# Patient Record
Sex: Female | Born: 1960 | Hispanic: Yes | Marital: Single | Smoking: Never smoker
Health system: Southern US, Community
[De-identification: ages and names within clinical notes are randomized; demographics above are authoritative.]

## PROBLEM LIST (undated history)

## (undated) DIAGNOSIS — D649 Anemia, unspecified: Secondary | ICD-10-CM

## (undated) DIAGNOSIS — I10 Essential (primary) hypertension: Secondary | ICD-10-CM

## (undated) DIAGNOSIS — J45909 Unspecified asthma, uncomplicated: Secondary | ICD-10-CM

## (undated) HISTORY — PX: TUBAL LIGATION: SHX77

---

## 2001-11-03 ENCOUNTER — Encounter: Payer: Self-pay | Admitting: Emergency Medicine

## 2001-11-03 ENCOUNTER — Emergency Department (HOSPITAL_COMMUNITY): Admission: EM | Admit: 2001-11-03 | Discharge: 2001-11-03 | Payer: Self-pay | Admitting: Emergency Medicine

## 2003-02-11 ENCOUNTER — Emergency Department (HOSPITAL_COMMUNITY): Admission: EM | Admit: 2003-02-11 | Discharge: 2003-02-12 | Payer: Self-pay | Admitting: Internal Medicine

## 2004-04-17 ENCOUNTER — Ambulatory Visit (HOSPITAL_COMMUNITY): Admission: RE | Admit: 2004-04-17 | Discharge: 2004-04-17 | Payer: Self-pay | Admitting: Family Medicine

## 2009-01-17 ENCOUNTER — Emergency Department (HOSPITAL_COMMUNITY): Admission: EM | Admit: 2009-01-17 | Discharge: 2009-01-18 | Payer: Self-pay | Admitting: Emergency Medicine

## 2010-06-03 LAB — URINALYSIS, ROUTINE W REFLEX MICROSCOPIC
Bilirubin Urine: NEGATIVE
Glucose, UA: NEGATIVE mg/dL
Hgb urine dipstick: NEGATIVE
Ketones, ur: NEGATIVE mg/dL
Nitrite: NEGATIVE
Protein, ur: NEGATIVE mg/dL
Specific Gravity, Urine: 1.015 (ref 1.005–1.030)
Urobilinogen, UA: 0.2 mg/dL (ref 0.0–1.0)
pH: 6 (ref 5.0–8.0)

## 2010-06-21 ENCOUNTER — Emergency Department (HOSPITAL_COMMUNITY)
Admission: EM | Admit: 2010-06-21 | Discharge: 2010-06-22 | Disposition: A | Discharge status: Home / Self Care | Payer: BC Managed Care – PPO | Attending: Emergency Medicine | Admitting: Emergency Medicine

## 2010-06-21 ENCOUNTER — Emergency Department (HOSPITAL_COMMUNITY): Payer: BC Managed Care – PPO

## 2010-06-21 DIAGNOSIS — R52 Pain, unspecified: Secondary | ICD-10-CM

## 2010-06-21 DIAGNOSIS — J45909 Unspecified asthma, uncomplicated: Secondary | ICD-10-CM | POA: Insufficient documentation

## 2010-06-21 DIAGNOSIS — R109 Unspecified abdominal pain: Secondary | ICD-10-CM | POA: Insufficient documentation

## 2010-06-21 DIAGNOSIS — I10 Essential (primary) hypertension: Secondary | ICD-10-CM | POA: Insufficient documentation

## 2010-06-21 LAB — COMPREHENSIVE METABOLIC PANEL
ALT: 21 U/L (ref 0–35)
AST: 23 U/L (ref 0–37)
Albumin: 3.8 g/dL (ref 3.5–5.2)
Alkaline Phosphatase: 105 U/L (ref 39–117)
BUN: 21 mg/dL (ref 6–23)
CO2: 27 mEq/L (ref 19–32)
Calcium: 10.4 mg/dL (ref 8.4–10.5)
Chloride: 102 mEq/L (ref 96–112)
Creatinine, Ser: 0.82 mg/dL (ref 0.4–1.2)
GFR calc Af Amer: >60 mL/min (ref >60)
GFR calc non Af Amer: >60 mL/min (ref >60)
Glucose, Bld: 102 mg/dL — ABNORMAL HIGH (ref 70–99)
Potassium: 3.3 mEq/L — ABNORMAL LOW (ref 3.5–5.1)
Sodium: 136 mEq/L (ref 135–145)
Total Bilirubin: 0.2 mg/dL — ABNORMAL LOW (ref 0.3–1.2)
Total Protein: 7.7 g/dL (ref 6.0–8.3)

## 2010-06-21 LAB — LIPASE, BLOOD: Lipase: 34 U/L (ref 11–59)

## 2010-06-21 LAB — URINE MICROSCOPIC-ADD ON

## 2010-06-21 LAB — DIFFERENTIAL
Basophils Absolute: 0.1 10*3/uL (ref 0.0–0.1)
Basophils Relative: 1 % (ref 0–1)
Eosinophils Absolute: 0.4 10*3/uL (ref 0.0–0.7)
Eosinophils Relative: 4 % (ref 0–5)
Lymphocytes Relative: 41 % (ref 12–46)
Lymphs Abs: 3.6 10*3/uL (ref 0.7–4.0)
Monocytes Absolute: 0.5 10*3/uL (ref 0.1–1.0)
Monocytes Relative: 6 % (ref 3–12)
Neutro Abs: 4.3 10*3/uL (ref 1.7–7.7)
Neutrophils Relative %: 49 % (ref 43–77)

## 2010-06-21 LAB — CBC
HCT: 39.7 % (ref 36.0–46.0)
Hemoglobin: 12.2 g/dL (ref 12.0–15.0)
MCH: 24.9 pg — ABNORMAL LOW (ref 26.0–34.0)
MCHC: 30.7 g/dL (ref 30.0–36.0)
MCV: 81 fL (ref 78.0–100.0)
Platelets: 353 10*3/uL (ref 150–400)
RBC: 4.9 MIL/uL (ref 3.87–5.11)
RDW: 16.6 % — ABNORMAL HIGH (ref 11.5–15.5)
WBC: 8.9 10*3/uL (ref 4.0–10.5)

## 2010-06-21 LAB — URINALYSIS, ROUTINE W REFLEX MICROSCOPIC
Bilirubin Urine: NEGATIVE
Glucose, UA: NEGATIVE mg/dL
Ketones, ur: NEGATIVE mg/dL
Leukocytes, UA: NEGATIVE
Nitrite: NEGATIVE
Protein, ur: NEGATIVE mg/dL
Specific Gravity, Urine: >1.03 — ABNORMAL HIGH (ref 1.005–1.030)
Urobilinogen, UA: 0.2 mg/dL (ref 0.0–1.0)
pH: 6 (ref 5.0–8.0)

## 2010-06-22 ENCOUNTER — Ambulatory Visit (HOSPITAL_COMMUNITY): Payer: BC Managed Care – PPO

## 2011-01-04 ENCOUNTER — Other Ambulatory Visit (HOSPITAL_COMMUNITY): Payer: Self-pay | Admitting: Internal Medicine

## 2011-01-04 DIAGNOSIS — N63 Unspecified lump in unspecified breast: Secondary | ICD-10-CM

## 2011-01-27 ENCOUNTER — Ambulatory Visit (HOSPITAL_COMMUNITY)
Admission: RE | Admit: 2011-01-27 | Discharge: 2011-01-27 | Disposition: A | Discharge status: Home / Self Care | Payer: BC Managed Care – PPO | Source: Ambulatory Visit | Attending: Internal Medicine | Admitting: Internal Medicine

## 2011-01-27 DIAGNOSIS — N63 Unspecified lump in unspecified breast: Secondary | ICD-10-CM

## 2011-01-27 DIAGNOSIS — N644 Mastodynia: Secondary | ICD-10-CM | POA: Insufficient documentation

## 2011-11-12 ENCOUNTER — Encounter (HOSPITAL_COMMUNITY): Payer: Self-pay | Admitting: *Deleted

## 2011-11-12 ENCOUNTER — Emergency Department (HOSPITAL_COMMUNITY)
Admission: EM | Admit: 2011-11-12 | Discharge: 2011-11-13 | Disposition: A | Discharge status: Home / Self Care | Payer: Medicaid Other | Attending: Emergency Medicine | Admitting: Emergency Medicine

## 2011-11-12 ENCOUNTER — Emergency Department (HOSPITAL_COMMUNITY): Payer: Medicaid Other

## 2011-11-12 DIAGNOSIS — R0981 Nasal congestion: Secondary | ICD-10-CM

## 2011-11-12 DIAGNOSIS — I10 Essential (primary) hypertension: Secondary | ICD-10-CM | POA: Insufficient documentation

## 2011-11-12 DIAGNOSIS — R079 Chest pain, unspecified: Secondary | ICD-10-CM

## 2011-11-12 DIAGNOSIS — R0602 Shortness of breath: Secondary | ICD-10-CM | POA: Insufficient documentation

## 2011-11-12 DIAGNOSIS — J45909 Unspecified asthma, uncomplicated: Secondary | ICD-10-CM | POA: Insufficient documentation

## 2011-11-12 DIAGNOSIS — R072 Precordial pain: Secondary | ICD-10-CM | POA: Insufficient documentation

## 2011-11-12 DIAGNOSIS — R059 Cough, unspecified: Secondary | ICD-10-CM | POA: Insufficient documentation

## 2011-11-12 DIAGNOSIS — R05 Cough: Secondary | ICD-10-CM | POA: Insufficient documentation

## 2011-11-12 DIAGNOSIS — J3489 Other specified disorders of nose and nasal sinuses: Secondary | ICD-10-CM | POA: Insufficient documentation

## 2011-11-12 DIAGNOSIS — M25569 Pain in unspecified knee: Secondary | ICD-10-CM | POA: Insufficient documentation

## 2011-11-12 HISTORY — DX: Unspecified asthma, uncomplicated: J45.909

## 2011-11-12 HISTORY — DX: Essential (primary) hypertension: I10

## 2011-11-12 HISTORY — DX: Anemia, unspecified: D64.9

## 2011-11-12 NOTE — ED Notes (Signed)
Interpreter at bedside for patient - patient states that she has had nasal congestion x1 week, states she is not feeling any better, currently taking abx and using Flonase.  States she is just tired of not being able to breathe through her nose, however, can breathe through her mouth fine.  No acute distress, no dyspnea, no tachypnea noted.

## 2011-11-12 NOTE — ED Notes (Signed)
Family reports [facility]. Started on antibiotics (amoxicillian) & a nasal spray. States feeling worse at this time. Also complaining of knee pain. Pt states not feeling any better.

## 2011-11-12 NOTE — ED Notes (Signed)
Patient transported to X-ray 

## 2011-11-13 ENCOUNTER — Emergency Department (HOSPITAL_COMMUNITY): Payer: Medicaid Other

## 2011-11-13 LAB — CBC WITH DIFFERENTIAL/PLATELET
Basophils Absolute: 0.1 10*3/uL (ref 0.0–0.1)
Basophils Relative: 1 % (ref 0–1)
Eosinophils Absolute: 0.8 10*3/uL — ABNORMAL HIGH (ref 0.0–0.7)
Eosinophils Relative: 8 % — ABNORMAL HIGH (ref 0–5)
HCT: 36.7 % (ref 36.0–46.0)
Hemoglobin: 11.8 g/dL — ABNORMAL LOW (ref 12.0–15.0)
Lymphocytes Relative: 43 % (ref 12–46)
Lymphs Abs: 3.9 10*3/uL (ref 0.7–4.0)
MCH: 28.1 pg (ref 26.0–34.0)
MCHC: 32.2 g/dL (ref 30.0–36.0)
MCV: 87.4 fL (ref 78.0–100.0)
Monocytes Absolute: 0.7 10*3/uL (ref 0.1–1.0)
Monocytes Relative: 8 % (ref 3–12)
Neutro Abs: 3.6 10*3/uL (ref 1.7–7.7)
Neutrophils Relative %: 40 % — ABNORMAL LOW (ref 43–77)
Platelets: 294 10*3/uL (ref 150–400)
RBC: 4.2 MIL/uL (ref 3.87–5.11)
RDW: 14.7 % (ref 11.5–15.5)
WBC: 9 10*3/uL (ref 4.0–10.5)

## 2011-11-13 LAB — BASIC METABOLIC PANEL
BUN: 17 mg/dL (ref 6–23)
CO2: 26 mEq/L (ref 19–32)
Calcium: 10.5 mg/dL (ref 8.4–10.5)
Chloride: 104 mEq/L (ref 96–112)
Creatinine, Ser: 0.67 mg/dL (ref 0.50–1.10)
GFR calc Af Amer: >90 mL/min (ref >90)
GFR calc non Af Amer: >90 mL/min (ref >90)
Glucose, Bld: 109 mg/dL — ABNORMAL HIGH (ref 70–99)
Potassium: 3.5 mEq/L (ref 3.5–5.1)
Sodium: 138 mEq/L (ref 135–145)

## 2011-11-13 LAB — PRO B NATRIURETIC PEPTIDE: Pro B Natriuretic peptide (BNP): 9.3 pg/mL (ref 0–125)

## 2011-11-13 LAB — TROPONIN I: Troponin I: <0.3 ng/mL (ref <0.30)

## 2011-11-13 MED ORDER — OXYMETAZOLINE HCL 0.05 % NA SOLN
1.0000 | Freq: Once | NASAL | Status: AC
  Administered 2011-11-13: 1 via NASAL
  Filled 2011-11-13: qty 15

## 2011-11-13 MED ORDER — SODIUM CHLORIDE 0.9 % IV SOLN
INTRAVENOUS | Status: DC
  Administered 2011-11-13: 01:00:00 via INTRAVENOUS

## 2011-11-13 MED ORDER — PREDNISONE 20 MG PO TABS
60.0000 mg | ORAL_TABLET | Freq: Once | ORAL | Status: AC
  Administered 2011-11-13: 60 mg via ORAL
  Filled 2011-11-13: qty 3

## 2011-11-13 MED ORDER — ALBUTEROL SULFATE (5 MG/ML) 0.5% IN NEBU
5.0000 mg | INHALATION_SOLUTION | Freq: Once | RESPIRATORY_TRACT | Status: AC
  Administered 2011-11-13: 5 mg via RESPIRATORY_TRACT
  Filled 2011-11-13: qty 1

## 2011-11-13 NOTE — ED Provider Notes (Signed)
0120 Assumed care/disposition of patient who presents with nasal congestions, shortness of breath, central chest pain. Awaiting labs and xray.   Results for orders placed during the hospital encounter of 11/12/11  CBC WITH DIFFERENTIAL      Component Value Range   WBC 9.0  4.0 - 10.5 K/uL   RBC 4.20  3.87 - 5.11 MIL/uL   Hemoglobin 11.8 (*) 12.0 - 15.0 g/dL   HCT 16.1  09.6 - 04.5 %   MCV 87.4  78.0 - 100.0 fL   MCH 28.1  26.0 - 34.0 pg   MCHC 32.2  30.0 - 36.0 g/dL   RDW 40.9  81.1 - 91.4 %   Platelets 294  150 - 400 K/uL   Neutrophils Relative 40 (*) 43 - 77 %   Neutro Abs 3.6  1.7 - 7.7 K/uL   Lymphocytes Relative 43  12 - 46 %   Lymphs Abs 3.9  0.7 - 4.0 K/uL   Monocytes Relative 8  3 - 12 %   Monocytes Absolute 0.7  0.1 - 1.0 K/uL   Eosinophils Relative 8 (*) 0 - 5 %   Eosinophils Absolute 0.8 (*) 0.0 - 0.7 K/uL   Basophils Relative 1  0 - 1 %   Basophils Absolute 0.1  0.0 - 0.1 K/uL  BASIC METABOLIC PANEL      Component Value Range   Sodium 138  135 - 145 mEq/L   Potassium 3.5  3.5 - 5.1 mEq/L   Chloride 104  96 - 112 mEq/L   CO2 26  19 - 32 mEq/L   Glucose, Bld 109 (*) 70 - 99 mg/dL   BUN 17  6 - 23 mg/dL   Creatinine, Ser 7.82  0.50 - 1.10 mg/dL   Calcium 95.6  8.4 - 21.3 mg/dL   GFR calc non Af Amer >90  >90 mL/min   GFR calc Af Amer >90  >90 mL/min  TROPONIN I      Component Value Range   Troponin I <0.30  <0.30 ng/mL  PRO B NATRIURETIC PEPTIDE      Component Value Range   Pro B Natriuretic peptide (BNP) 9.3  0 - 125 pg/mL   Dg Chest 2 View  11/13/2011  *RADIOLOGY REPORT*  Clinical Data: Cough and shortness of breath.  History of asthma.  CHEST - 2 VIEW  Comparison: 01/17/2009 and 02/11/2003 Chest radiographs.  Findings: Upper limits normal heart size again noted. Peribronchial thickening is unchanged.  There is no evidence of focal airspace disease, pulmonary edema, suspicious pulmonary nodule/mass, pleural effusion, or pneumothorax. No acute bony abnormalities  are identified.  IMPRESSION: No evidence of acute cardiopulmonary disease.  Upper limits normal heart size and chronic peribronchial thickening.   Original Report Authenticated By: Rosendo Gros, M.D.    Dg Knee Complete 4 Views Right  11/13/2011  *RADIOLOGY REPORT*  Clinical Data: Chronic right knee pain.  RIGHT KNEE - COMPLETE 4+ VIEW  Comparison: None.  Findings: No fracture or dislocation.  No aggressive osseous lesions.  There may be a joint effusion.  IMPRESSION: Possible joint effusion.  No acute osseous finding.   Original Report Authenticated By: Waneta Martins, M.D.    [facility] Dx testing d/w pt and daughter  Questions answered.  Verb understanding, agreeable to d/c home with outpt f/u.Patient given nebulizer treatment with improvement. Given afrin with improvement. She will finish prescribed medicines from the clinic and follow up with them. Daughter served as Nurse, learning disability.  MDM Reviewed: nursing  note and vitals Interpretation: labs and x-ray       Nicoletta Dress. Colon Branch, MD 11/13/11 [phone]

## 2011-11-13 NOTE — ED Provider Notes (Signed)
History     CSN: [id number]  Arrival date & time 11/12/11  2307   First MD Initiated Contact with Patient 11/12/11 2330      Chief Complaint  Patient presents with  . Chest Pain  . Shortness of Breath    (Consider location/radiation/quality/duration/timing/severity/associated sxs/prior treatment) HPI Comments: Pain is mid-sternal and "heavy".  States she has been having the pain and trouble breathing x 3 weeks.  She has a h/o asthma and HTN .  Over the past 2 years , probably every 3 months she has episodes of "fast heart beat and thinks she is going to pass out"  Last episode ~ 2 months ago.  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  Patient is a 51 y.o. female presenting with chest pain and shortness of breath. The history is provided by the patient. Language Interpreter Used: pt's daughter transating from spanish.  Chest Pain Episode onset: 3 weeks ago. Chest pain occurs constantly. The chest pain is unchanged. The pain is associated with breathing. The severity of the pain is moderate. The quality of the pain is described as heavy. The pain does not radiate. Primary symptoms include shortness of breath, cough and wheezing. Pertinent negatives for primary symptoms include no fever. She tried nothing for the symptoms. Risk factors include obesity.  Her past medical history is significant for hypertension.  Pertinent negatives for past medical history include no diabetes, no hyperlipidemia, no MI, no pacemaker, no recent injury and no rheumatic fever.    Shortness of Breath  Associated symptoms include chest pain, cough, shortness of breath and wheezing. Pertinent negatives include no fever.    Past Medical History  Diagnosis Date  . Asthma   . Hypertension   . Anemia     Past Surgical History  Procedure Date  . Cesarean section   . Tubal ligation     History reviewed. No pertinent family history.  History  Substance Use Topics  .  Smoking status: Never Smoker   . Smokeless tobacco: Not on file  . Alcohol Use: No    OB History    Grav Para Term Preterm Abortions TAB SAB Ect Mult Living                  Review of Systems  Constitutional: Negative for fever and chills.  Respiratory: Positive for cough, chest tightness, shortness of breath and wheezing.   Cardiovascular: Positive for chest pain.  All other systems reviewed and are negative.    Allergies  Review of patient's allergies indicates no known allergies.  Home Medications   Current Outpatient Rx  Name Route Sig Dispense Refill  . ALBUTEROL SULFATE HFA 108 (90 BASE) MCG/ACT IN AERS Inhalation Inhale 2 puffs into the lungs every 6 (six) hours as needed.    . AMOXICILLIN 500 MG PO CAPS Oral Take 500 mg by mouth 2 (two) times daily.    Marland Kitchen FLUTICASONE FUROATE 27.5 MCG/SPRAY NA SUSP Nasal Place 1 spray into the nose 2 (two) times daily.    Marland Kitchen LISINOPRIL 10 MG PO TABS Oral Take 10 mg by mouth daily.      BP 153/92  Pulse 88  Temp 98.2 F (36.8 C) (Oral)  Resp 22  Ht 5\' 2"  (1.575 m)  Wt 218 lb (98.884 kg)  BMI 39.87 kg/m2  SpO2 93%  Physical Exam  Nursing note and vitals reviewed. Constitutional: She is oriented to person, place, and time. She appears well-developed  and well-nourished. No distress.  HENT:  Head: Normocephalic and atraumatic.  Eyes: EOM are normal.  Neck: Normal range of motion.  Cardiovascular: Normal rate, regular rhythm and normal heart sounds.   Pulmonary/Chest: Effort normal. No accessory muscle usage. Not tachypneic. No respiratory distress. She has no decreased breath sounds. She has wheezes. She has rhonchi. She has no rales. She exhibits no tenderness.    Abdominal: Soft. She exhibits no distension. There is no tenderness.  Musculoskeletal: Normal range of motion.  Neurological: She is alert and oriented to person, place, and time.  Skin: Skin is warm and dry.  Psychiatric: She has a normal mood and affect. Judgment  normal.    ED Course  Procedures (including critical care time)   Labs Reviewed  CBC WITH DIFFERENTIAL  BASIC METABOLIC PANEL  TROPONIN I  PRO B NATRIURETIC PEPTIDE   Dg Chest 2 View  11/13/2011  *RADIOLOGY REPORT*  Clinical Data: Cough and shortness of breath.  History of asthma.  CHEST - 2 VIEW  Comparison: 01/17/2009 and 02/11/2003 Chest radiographs.  Findings: Upper limits normal heart size again noted. Peribronchial thickening is unchanged.  There is no evidence of focal airspace disease, pulmonary edema, suspicious pulmonary nodule/mass, pleural effusion, or pneumothorax. No acute bony abnormalities are identified.  IMPRESSION: No evidence of acute cardiopulmonary disease.  Upper limits normal heart size and chronic peribronchial thickening.   Original Report Authenticated By: Rosendo Gros, M.D.      No diagnosis found.  Date: 11/13/2011  Rate: 76  Rhythm: normal sinus rhythm  QRS Axis: normal  Intervals: normal  ST/T Wave abnormalities: normal  Conduction Disutrbances:none  Narrative Interpretation:   Old EKG Reviewed: unchanged from 06-21-2010  0115=d/w dr. Colon Branch who has assumed care.   MDM          Evalina Field, PA 11/13/11 [phone]

## 2011-11-13 NOTE — ED Provider Notes (Signed)
Medical screening examination/treatment/procedure(s) were conducted as a shared visit with non-physician practitioner(s) and myself.  I personally evaluated the patient during the encounter  Jenny Gregory. Colon Branch, MD 11/13/11 [phone]

## 2012-01-03 ENCOUNTER — Other Ambulatory Visit: Payer: Self-pay | Admitting: Orthopedic Surgery

## 2012-01-10 NOTE — Progress Notes (Signed)
Talked with daughter-will need istat Interpretor requested-spanish

## 2012-01-11 NOTE — H&P (Signed)
HPI: Patient presents with a chief complaint of right knee pain that she's had intermittently for 6 years, but got dramatically worse 1 month ago.  She is now limping the pain wakes her up at night and over-the-counter Advil or Aleve have not helped.  She denies any other trauma.  She describes the pain as extremely severe with associated swelling worse over the last few days with a sharp and stabbing quality.  She is seen today with a family member who speaks good [place] as she does not. She was given a cortisone injection 12-19-11 on her first visit.  She reports that over half the pain is still gone, but she does walk with a limp and the pain is gradually returning.  All: None  ROS: 14 point review of systems form filled out by the patient was reviewed and was negative as it relates to the history of present illness except for:  She's not a diabetic, no elevated cholesterol, has a history of high blood pressure and asthma, but does not have her medicines with her.  PMH: Not contributory  FHx: Negative  SocHx:  She does not smoke.  She does not use alcohol she is single and lives alone another person and has been off work now for 10 months.  PHYSICAL EXAM: Well-developed, well-nourished.  Awake, alert, and oriented x3.  Extraocular motion is intact.  No use of accessory respiratory muscles for breathing.   Cardiovascular exam reveals a regular rhythm.  Skin is intact without cuts, scrapes, or abrasions. The right knee comes to full extension with significant medial joint line pain when he do this.  She flexes to 95, again with severe medial joint line pain when he do this.  There is no palpable effusion she is quite tender along the medial joint line, McMurray's test reproduces pain without palpable click.  She is neurovascularly intact no cuts.  Scrapes or abrasions to the skin.  Imaging/Tests: AP, obliques, and lateral x-rays done [facility] believe [facility] in [place] show a small  spur to the medial side of the left knee joint on the tibia and a good fit flare of cartilage.  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  We went over the risks and benefits of surgery.  She'll carefully weighing her options and call us back on the phone if she decides on surgery.  In the meantime, she will continue over-the-counter anti-inflammatory medicines and exercises.  I will see her back as needed.  She may call the schedule surgery at her convenience.

## 2012-01-12 ENCOUNTER — Encounter (HOSPITAL_BASED_OUTPATIENT_CLINIC_OR_DEPARTMENT_OTHER): Payer: Self-pay | Admitting: Anesthesiology

## 2012-01-12 ENCOUNTER — Ambulatory Visit (HOSPITAL_BASED_OUTPATIENT_CLINIC_OR_DEPARTMENT_OTHER): Payer: Medicaid Other | Admitting: Anesthesiology

## 2012-01-12 ENCOUNTER — Encounter (HOSPITAL_BASED_OUTPATIENT_CLINIC_OR_DEPARTMENT_OTHER): Payer: Self-pay | Admitting: *Deleted

## 2012-01-12 ENCOUNTER — Ambulatory Visit (HOSPITAL_BASED_OUTPATIENT_CLINIC_OR_DEPARTMENT_OTHER)
Admission: RE | Admit: 2012-01-12 | Discharge: 2012-01-12 | Disposition: A | Discharge status: Home / Self Care | Payer: Medicaid Other | Source: Ambulatory Visit | Attending: Orthopedic Surgery | Admitting: Orthopedic Surgery

## 2012-01-12 ENCOUNTER — Encounter (HOSPITAL_BASED_OUTPATIENT_CLINIC_OR_DEPARTMENT_OTHER)
Admission: RE | Disposition: A | Discharge status: Home / Self Care | Payer: Self-pay | Source: Ambulatory Visit | Attending: Orthopedic Surgery

## 2012-01-12 DIAGNOSIS — J45909 Unspecified asthma, uncomplicated: Secondary | ICD-10-CM | POA: Insufficient documentation

## 2012-01-12 DIAGNOSIS — M23305 Other meniscus derangements, unspecified medial meniscus, unspecified knee: Secondary | ICD-10-CM | POA: Insufficient documentation

## 2012-01-12 DIAGNOSIS — S83209A Unspecified tear of unspecified meniscus, current injury, unspecified knee, initial encounter: Secondary | ICD-10-CM

## 2012-01-12 DIAGNOSIS — M9420 Chondromalacia, unspecified site: Secondary | ICD-10-CM | POA: Insufficient documentation

## 2012-01-12 HISTORY — PX: KNEE ARTHROSCOPY WITH MEDIAL MENISECTOMY: SHX5651

## 2012-01-12 LAB — POCT I-STAT, CHEM 8
Calcium, Ion: 1.21 mmol/L (ref 1.12–1.23)
Glucose, Bld: 92 mg/dL (ref 70–99)
HCT: 46 % (ref 36.0–46.0)
Hemoglobin: 15.6 g/dL — ABNORMAL HIGH (ref 12.0–15.0)
Potassium: 3.9 mEq/L (ref 3.5–5.1)

## 2012-01-12 SURGERY — ARTHROSCOPY, KNEE, WITH MEDIAL MENISCECTOMY
Anesthesia: General | Site: Knee | Laterality: Right | Wound class: Clean

## 2012-01-12 MED ORDER — LIDOCAINE HCL (CARDIAC) 20 MG/ML IV SOLN
INTRAVENOUS | Status: DC | PRN
  Administered 2012-01-12: 50 mg via INTRAVENOUS

## 2012-01-12 MED ORDER — CEFAZOLIN SODIUM-DEXTROSE 2-3 GM-% IV SOLR: 2.0000 g | INTRAVENOUS | Status: DC

## 2012-01-12 MED ORDER — ROPIVACAINE HCL 5 MG/ML IJ SOLN: INTRAMUSCULAR | Status: DC | PRN

## 2012-01-12 MED ORDER — LACTATED RINGERS IV SOLN
INTRAVENOUS | Status: DC
  Administered 2012-01-12 (×2): via INTRAVENOUS

## 2012-01-12 MED ORDER — MIDAZOLAM HCL 2 MG/2ML IJ SOLN
1.0000 mg | INTRAMUSCULAR | Status: DC | PRN
  Administered 2012-01-12: 2 mg via INTRAVENOUS

## 2012-01-12 MED ORDER — ROPIVACAINE HCL 5 MG/ML IJ SOLN
INTRAMUSCULAR | Status: DC | PRN
  Administered 2012-01-12: 25 mL via EPIDURAL

## 2012-01-12 MED ORDER — PROPOFOL 10 MG/ML IV EMUL
INTRAVENOUS | Status: DC | PRN
  Administered 2012-01-12: 100 ug/kg/min via INTRAVENOUS

## 2012-01-12 MED ORDER — HYDROCODONE-ACETAMINOPHEN 5-325 MG PO TABS: 1.0000 | ORAL_TABLET | ORAL | Status: AC | PRN

## 2012-01-12 MED ORDER — OXYCODONE HCL 5 MG PO TABS: 5.0000 mg | ORAL_TABLET | Freq: Once | ORAL | Status: DC | PRN

## 2012-01-12 MED ORDER — ACETAMINOPHEN 10 MG/ML IV SOLN
1000.0000 mg | Freq: Once | INTRAVENOUS | Status: AC
  Administered 2012-01-12: 1000 mg via INTRAVENOUS

## 2012-01-12 MED ORDER — FENTANYL CITRATE 0.05 MG/ML IJ SOLN
INTRAMUSCULAR | Status: DC | PRN
  Administered 2012-01-12 (×2): 25 ug via INTRAVENOUS

## 2012-01-12 MED ORDER — ONDANSETRON HCL 4 MG/2ML IJ SOLN
INTRAMUSCULAR | Status: DC | PRN
  Administered 2012-01-12: 4 mg via INTRAVENOUS

## 2012-01-12 MED ORDER — OXYCODONE HCL 5 MG/5ML PO SOLN: 5.0000 mg | Freq: Once | ORAL | Status: DC | PRN

## 2012-01-12 MED ORDER — BUPIVACAINE-EPINEPHRINE PF 0.5-1:200000 % IJ SOLN
INTRAMUSCULAR | Status: DC | PRN
  Administered 2012-01-12: 20 mL

## 2012-01-12 MED ORDER — HYDROMORPHONE HCL PF 1 MG/ML IJ SOLN: 0.2500 mg | INTRAMUSCULAR | Status: DC | PRN

## 2012-01-12 MED ORDER — CHLORHEXIDINE GLUCONATE 4 % EX LIQD: 60.0000 mL | Freq: Once | CUTANEOUS | Status: DC

## 2012-01-12 MED ORDER — FENTANYL CITRATE 0.05 MG/ML IJ SOLN
50.0000 ug | INTRAMUSCULAR | Status: DC | PRN
  Administered 2012-01-12: 100 ug via INTRAVENOUS

## 2012-01-12 MED ORDER — SODIUM CHLORIDE 0.9 % IR SOLN
Status: DC | PRN
  Administered 2012-01-12: 1000 mL

## 2012-01-12 MED ORDER — KETOROLAC TROMETHAMINE 30 MG/ML IJ SOLN
INTRAMUSCULAR | Status: DC | PRN
  Administered 2012-01-12: 30 mg via INTRAVENOUS

## 2012-01-12 MED ORDER — DEXTROSE-NACL 5-0.45 % IV SOLN: INTRAVENOUS | Status: DC

## 2012-01-12 SURGICAL SUPPLY — 40 items
BANDAGE ELASTIC 6 VELCRO ST LF (GAUZE/BANDAGES/DRESSINGS) ×2 IMPLANT
BLADE 4.2CUDA (BLADE) IMPLANT
BLADE CUTTER GATOR 3.5 (BLADE) ×2 IMPLANT
BLADE GREAT WHITE 4.2 (BLADE) ×2 IMPLANT
CANISTER OMNI JUG 16 LITER (MISCELLANEOUS) IMPLANT
CANISTER SUCTION 2500CC (MISCELLANEOUS) IMPLANT
CHLORAPREP W/TINT 26ML (MISCELLANEOUS) ×2 IMPLANT
CLOTH BEACON ORANGE TIMEOUT ST (SAFETY) ×2 IMPLANT
DECANTER SPIKE VIAL GLASS SM (MISCELLANEOUS) IMPLANT
DRAPE ARTHROSCOPY W/POUCH 114 (DRAPES) ×2 IMPLANT
ELECT MENISCUS 165MM 90D (ELECTRODE) IMPLANT
ELECT REM PT RETURN 9FT ADLT (ELECTROSURGICAL)
ELECTRODE REM PT RTRN 9FT ADLT (ELECTROSURGICAL) IMPLANT
GAUZE XEROFORM 1X8 LF (GAUZE/BANDAGES/DRESSINGS) ×2 IMPLANT
GLOVE BIO SURGEON STRL SZ 6.5 (GLOVE) ×2 IMPLANT
GLOVE BIO SURGEON STRL SZ7 (GLOVE) ×2 IMPLANT
GLOVE BIO SURGEON STRL SZ7.5 (GLOVE) ×2 IMPLANT
GLOVE BIOGEL PI IND STRL 7.0 (GLOVE) ×1 IMPLANT
GLOVE BIOGEL PI IND STRL 8 (GLOVE) ×1 IMPLANT
GLOVE BIOGEL PI INDICATOR 7.0 (GLOVE) ×1
GLOVE BIOGEL PI INDICATOR 8 (GLOVE) ×1
GLOVE INDICATOR 7.0 STRL GRN (GLOVE) ×2 IMPLANT
GOWN PREVENTION PLUS XLARGE (GOWN DISPOSABLE) ×6 IMPLANT
KNEE WRAP E Z 3 GEL PACK (MISCELLANEOUS) ×2 IMPLANT
NDL SAFETY ECLIPSE 18X1.5 (NEEDLE) IMPLANT
NEEDLE FILTER BLUNT 18X 1/2SAF (NEEDLE) ×1
NEEDLE FILTER BLUNT 18X1 1/2 (NEEDLE) ×1 IMPLANT
NEEDLE HYPO 18GX1.5 SHARP (NEEDLE)
PACK ARTHROSCOPY DSU (CUSTOM PROCEDURE TRAY) ×2 IMPLANT
PACK BASIN DAY SURGERY FS (CUSTOM PROCEDURE TRAY) ×2 IMPLANT
PENCIL BUTTON HOLSTER BLD 10FT (ELECTRODE) IMPLANT
SET ARTHROSCOPY TUBING (MISCELLANEOUS) ×1
SET ARTHROSCOPY TUBING LN (MISCELLANEOUS) ×1 IMPLANT
SLEEVE SCD COMPRESS KNEE MED (MISCELLANEOUS) IMPLANT
SPONGE GAUZE 4X4 12PLY (GAUZE/BANDAGES/DRESSINGS) ×2 IMPLANT
SYR 3ML 18GX1 1/2 (SYRINGE) IMPLANT
SYR 5ML LL (SYRINGE) IMPLANT
TOWEL OR 17X24 6PK STRL BLUE (TOWEL DISPOSABLE) ×2 IMPLANT
WAND STAR VAC 90 (SURGICAL WAND) IMPLANT
WATER STERILE IRR 1000ML POUR (IV SOLUTION) ×2 IMPLANT

## 2012-01-12 NOTE — Op Note (Signed)
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 Turner Daniels M.D.  Assist: Shirl Harris PA-C  Anes: General LMA  EBL: Minimal  Fluids: 800 cc   Indications: Patient is a history of catching popping pain with occasional effusions in her right knee. The pain causes her to limp plain radiographs show minimal arthritic changes she is tender along the medial joint line and McMurray's test elicits pain but no specific pop.. Pt has failed conservative treatment with anti-inflammatory medicines, physical therapy, and modified activites but did get good temporarily from an intra-articular cortisone injection. Pain has recurred and patient desires elective arthroscopic evaluation and treatment of knee. Risks and benefits of surgery have been discussed and questions answered.  Procedure: Patient identified by arm band and taken to the operating room at the day [facility]. The appropriate anesthetic monitors were attached, and General LMA anesthesia was induced without difficulty. Lateral post was applied to the table and the lower extremity was prepped and draped in usual sterile fashion from the ankle to the midthigh. Time out procedure was performed. We began the operation by making standard inferior lateral and inferior medial peripatellar portals with a #11 blade allowing introduction of the arthroscope through the inferior lateral portal and the out flow to the inferior medial portal. Pump pressure was set at 100 mmHg and diagnostic arthroscopy  revealed the patellofemoral joint had minimal chondromalacia the articular cartilage of the patella and trochlea was in excellent condition, moving into the medial compartment the medial meniscus had a large  parrot-beak tear going from mid medial towards the posterior aspect of the meniscus. This was removed with a 4.2 gray-white sucker shaver, a 35 Gator sucker shaver and a 45 right angle biter. The patient also had grade 3 chondromalacia with flap tears to the medial femoral condyle that was debrided. The anterior cruciate ligament and PCL are intact. Moving into the lateral compartment the articular cartilage of the lateral tibial plateau had grade 3 correlation flap tears along a vertical stripe and this is debrider with a 3.5 mm Gator sucker shaver. The lateral meniscus had minimal degenerative tearing this was lightly debrided. The gutters were then cleared medially and laterally. In the menisci were thoroughly probed.. The knee was irrigated out normal saline solution. A dressing of xerofoam 4 x 4 dressing sponges, web roll and an Ace wrap was applied. The patient was awakened extubated and taken to the recovery without difficulty.    Signed: Nestor Lewandowsky, MD

## 2012-01-12 NOTE — Anesthesia Postprocedure Evaluation (Signed)
  Anesthesia Post-op Note  Patient: Jenny Gregory  Procedure(s) Performed: Procedure(s) (LRB) with comments: KNEE ARTHROSCOPY WITH MEDIAL MENISECTOMY (Right) - PARTIAL MEDIAL MENISECTOMY, chondroplasty  Patient Location: PACU  Anesthesia Type:MAC  Level of Consciousness: awake, alert  and oriented  Airway and Oxygen Therapy: Patient Spontanous Breathing  Post-op Pain: none  Post-op Assessment: Post-op Vital signs reviewed, Patient's Cardiovascular Status Stable, Respiratory Function Stable, Patent Airway and No signs of Nausea or vomiting  Post-op Vital Signs: Reviewed and stable  Complications: No apparent anesthesia complications

## 2012-01-12 NOTE — Anesthesia Preprocedure Evaluation (Signed)
Anesthesia Evaluation  Patient identified by MRN, date of birth, ID band Patient awake    Reviewed: Allergy & Precautions, H&P , NPO status , Patient's Chart, lab work & pertinent test results  Airway Mallampati: III TM Distance: >3 FB Neck ROM: Full    Dental No notable dental hx. (+) Teeth Intact and Dental Advisory Given   Pulmonary asthma ,  breath sounds clear to auscultation  Pulmonary exam normal       Cardiovascular hypertension, On Medications Rhythm:Regular Rate:Normal     Neuro/Psych negative neurological ROS  negative psych ROS   GI/Hepatic negative GI ROS, Neg liver ROS,   Endo/Other  Morbid obesity  Renal/GU negative Renal ROS  negative genitourinary   Musculoskeletal   Abdominal (+) + obese,   Peds  Hematology negative hematology ROS (+)   Anesthesia Other Findings   Reproductive/Obstetrics negative OB ROS                           Anesthesia Physical Anesthesia Plan  ASA: III  Anesthesia Plan: MAC   Post-op Pain Management:    Induction: Intravenous  Airway Management Planned: Simple Face Mask  Additional Equipment:   Intra-op Plan:   Post-operative Plan:   Informed Consent: I have reviewed the patients History and Physical, chart, labs and discussed the procedure including the risks, benefits and alternatives for the proposed anesthesia with the patient or authorized representative who has indicated his/her understanding and acceptance.   Dental advisory given  Plan Discussed with: CRNA  Anesthesia Plan Comments:         Anesthesia Quick Evaluation

## 2012-01-12 NOTE — Progress Notes (Signed)
Assisted Dr. Fitzgerald with right, knee block. Side rails up, monitors on throughout procedure. See vital signs in flow sheet. Tolerated Procedure well. 

## 2012-01-12 NOTE — Interval H&P Note (Signed)
History and Physical Interval Note:  01/12/2012 1:17 PM  Jenny Gregory  has presented today for surgery, with the diagnosis of RIGHT MEDIAL MENISCUS TEAR  The various methods of treatment have been discussed with the patient and family. After consideration of risks, benefits and other options for treatment, the patient has consented to  Procedure(s) (LRB) with comments: KNEE ARTHROSCOPY WITH MEDIAL MENISECTOMY (Right) - PARTIAL MEDIAL MENISECTOMY as a surgical intervention .  The patient's history has been reviewed, patient examined, no change in status, stable for surgery.  I have reviewed the patient's chart and labs.  Questions were answered to the patient's satisfaction.     Nestor Lewandowsky

## 2012-01-12 NOTE — Anesthesia Procedure Notes (Addendum)
Procedure Name: MAC Date/Time: 01/12/2012 1:24 PM Performed by: Caren Macadam Pre-anesthesia Checklist: Patient identified, Emergency Drugs available, Suction available and Patient being monitored Patient Re-evaluated:Patient Re-evaluated prior to inductionOxygen Delivery Method: Simple face mask Preoxygenation: Pre-oxygenation with 100% oxygen Intubation Type: IV induction Placement Confirmation: positive ETCO2    Intraarticular knee injection with 25cc of 0.5% Ropivicaine. Bilateral port site injection with a total of 20cc of 1.5% Mepivicaine. Sterile prep and drape. All injections with negative aspiration of heme.

## 2012-01-12 NOTE — Transfer of Care (Signed)
Immediate Anesthesia Transfer of Care Note  Patient: Jenny Gregory  Procedure(s) Performed: Procedure(s) (LRB) with comments: KNEE ARTHROSCOPY WITH MEDIAL MENISECTOMY (Right) - PARTIAL MEDIAL MENISECTOMY, chondroplasty  Patient Location: PACU  Anesthesia Type:General  Level of Consciousness: awake and alert   Airway & Oxygen Therapy: Patient Spontanous Breathing and Patient connected to face mask oxygen  Post-op Assessment: Report given to PACU RN and Post -op Vital signs reviewed and stable  Post vital signs: Reviewed and stable  Complications: No apparent anesthesia complications

## 2012-01-13 ENCOUNTER — Encounter (HOSPITAL_BASED_OUTPATIENT_CLINIC_OR_DEPARTMENT_OTHER): Payer: Self-pay | Admitting: Orthopedic Surgery

## 2012-06-22 ENCOUNTER — Other Ambulatory Visit (HOSPITAL_COMMUNITY): Payer: Self-pay | Admitting: *Deleted

## 2012-06-22 DIAGNOSIS — Z139 Encounter for screening, unspecified: Secondary | ICD-10-CM

## 2012-06-27 ENCOUNTER — Ambulatory Visit (HOSPITAL_COMMUNITY)
Admission: RE | Admit: 2012-06-27 | Discharge: 2012-06-27 | Disposition: A | Discharge status: Home / Self Care | Payer: Medicaid Other | Source: Ambulatory Visit | Attending: *Deleted | Admitting: *Deleted

## 2012-06-27 DIAGNOSIS — Z139 Encounter for screening, unspecified: Secondary | ICD-10-CM

## 2012-06-27 DIAGNOSIS — Z1231 Encounter for screening mammogram for malignant neoplasm of breast: Secondary | ICD-10-CM | POA: Insufficient documentation

## 2014-03-14 ENCOUNTER — Encounter (HOSPITAL_COMMUNITY): Payer: Self-pay | Admitting: Emergency Medicine

## 2014-03-14 ENCOUNTER — Emergency Department (HOSPITAL_COMMUNITY): Payer: Self-pay

## 2014-03-14 ENCOUNTER — Emergency Department (HOSPITAL_COMMUNITY)
Admission: EM | Admit: 2014-03-14 | Discharge: 2014-03-14 | Disposition: A | Discharge status: Home / Self Care | Payer: Self-pay | Attending: Emergency Medicine | Admitting: Emergency Medicine

## 2014-03-14 DIAGNOSIS — Z79899 Other long term (current) drug therapy: Secondary | ICD-10-CM | POA: Insufficient documentation

## 2014-03-14 DIAGNOSIS — Y9289 Other specified places as the place of occurrence of the external cause: Secondary | ICD-10-CM | POA: Insufficient documentation

## 2014-03-14 DIAGNOSIS — Z862 Personal history of diseases of the blood and blood-forming organs and certain disorders involving the immune mechanism: Secondary | ICD-10-CM | POA: Insufficient documentation

## 2014-03-14 DIAGNOSIS — Z9851 Tubal ligation status: Secondary | ICD-10-CM | POA: Insufficient documentation

## 2014-03-14 DIAGNOSIS — J45909 Unspecified asthma, uncomplicated: Secondary | ICD-10-CM | POA: Insufficient documentation

## 2014-03-14 DIAGNOSIS — R109 Unspecified abdominal pain: Secondary | ICD-10-CM

## 2014-03-14 DIAGNOSIS — X58XXXA Exposure to other specified factors, initial encounter: Secondary | ICD-10-CM | POA: Insufficient documentation

## 2014-03-14 DIAGNOSIS — S39011A Strain of muscle, fascia and tendon of abdomen, initial encounter: Secondary | ICD-10-CM | POA: Insufficient documentation

## 2014-03-14 DIAGNOSIS — Z7951 Long term (current) use of inhaled steroids: Secondary | ICD-10-CM | POA: Insufficient documentation

## 2014-03-14 DIAGNOSIS — Y990 Civilian activity done for income or pay: Secondary | ICD-10-CM | POA: Insufficient documentation

## 2014-03-14 DIAGNOSIS — I10 Essential (primary) hypertension: Secondary | ICD-10-CM | POA: Insufficient documentation

## 2014-03-14 DIAGNOSIS — Y9389 Activity, other specified: Secondary | ICD-10-CM | POA: Insufficient documentation

## 2014-03-14 MED ORDER — HYDROCODONE-ACETAMINOPHEN 5-325 MG PO TABS: 2.0000 | ORAL_TABLET | ORAL | Status: DC | PRN

## 2014-03-14 NOTE — ED Provider Notes (Signed)
CSN: [id number]     Arrival date & time 03/14/14  2013 History  This chart was scribed for Gilda Crease, by Richarda Overlie, ED Scribe. This patient was seen in room APA05/APA05 and the patient's care was started 10:20 PM.      Chief Complaint  Patient presents with  . Abdominal Pain   The history is provided by the patient and a relative. No language interpreter was used.   HPI Comments: Jenny Gregory is a 54 y.o. female with a history of asthma and HTN who presents to the Emergency Department complaining of constant, gradually worsening abdominal pain since yesterday. Pt reports she was moving bins of laundry at work yesterday when she felt and heard a pop in her left lower abdominal area. She states the pain is same at rest as it is if she is moving. Her daughter reports pt had trouble sleeping last night due to her pain. She denies back pain.    Past Medical History  Diagnosis Date  . Asthma   . Hypertension   . Anemia    Past Surgical History  Procedure Laterality Date  . Cesarean section    . Tubal ligation    . Knee arthroscopy with medial menisectomy  01/12/2012    Procedure: KNEE ARTHROSCOPY WITH MEDIAL MENISECTOMY;  Surgeon: Nestor Lewandowsky, MD;  Location: [facility];  Service: Orthopedics;  Laterality: Right;  PARTIAL MEDIAL MENISECTOMY, chondroplasty   History reviewed. No pertinent family history. History  Substance Use Topics  . Smoking status: Never Smoker   . Smokeless tobacco: Not on file  . Alcohol Use: No   OB History    No data available     Review of Systems  Gastrointestinal: Positive for abdominal pain.  All other systems reviewed and are negative.     Allergies  Review of patient's allergies indicates no known allergies.  Home Medications   Prior to Admission medications   Medication Sig Start Date End Date Taking? Authorizing Provider  albuterol (PROVENTIL HFA;VENTOLIN HFA) 108 (90 BASE) MCG/ACT inhaler Inhale 2  puffs into the lungs every 6 (six) hours as needed.    Historical Provider, MD  fluticasone (VERAMYST) 27.5 MCG/SPRAY nasal spray Place 1 spray into the nose 2 (two) times daily.    Historical Provider, MD  ibuprofen (ADVIL,MOTRIN) 200 MG tablet Take 200 mg by mouth every 6 (six) hours as needed.    Historical Provider, MD  lisinopril (PRINIVIL,ZESTRIL) 10 MG tablet Take 10 mg by mouth daily.    Historical Provider, MD   BP 126/49 mmHg  Pulse 75  Temp(Src) 97.6 F (36.4 C) (Oral)  Resp 20  Ht  (1.626 m)  Wt 229 lb 5 oz (104.015 kg)  BMI 39.34 kg/m2  SpO2 99% Physical Exam  Constitutional: She is oriented to person, place, and time. She appears well-developed and well-nourished. No distress.  HENT:  Head: Normocephalic and atraumatic.  Right Ear: Hearing normal.  Left Ear: Hearing normal.  Nose: Nose normal.  Mouth/Throat: Oropharynx is clear and moist and mucous membranes are normal.  Eyes: Conjunctivae and EOM are normal. Pupils are equal, round, and reactive to light.  Neck: Normal range of motion. Neck supple.  Cardiovascular: Regular rhythm, S1 normal and S2 normal.  Exam reveals no gallop and no friction rub.   No murmur heard. Pulmonary/Chest: Effort normal and breath sounds normal. No respiratory distress. She exhibits no tenderness.  Abdominal: Soft. Normal appearance and bowel sounds are normal. There  is no hepatosplenomegaly. There is tenderness. There is no rebound, no guarding, no tenderness at McBurney's point and negative Murphy's sign. No hernia.  Tenderness in left mid abdomen.   Musculoskeletal: Normal range of motion.  Neurological: She is alert and oriented to person, place, and time. She has normal strength. No cranial nerve deficit or sensory deficit. Coordination normal. GCS eye subscore is 4. GCS verbal subscore is 5. GCS motor subscore is 6.  Skin: Skin is warm, dry and intact. No rash noted. No cyanosis.  Psychiatric: She has a normal mood and affect. Her  speech is normal and behavior is normal. Thought content normal.  Nursing note and vitals reviewed.   ED Course  Procedures   DIAGNOSTIC STUDIES: Oxygen Saturation is 99% on RA, normal by my interpretation.    COORDINATION OF CARE: 10:26 PM Discussed treatment plan with pt at bedside and pt agreed to plan.  Labs Review Labs Reviewed - No data to display  Imaging Review Dg Abd Acute W/chest  03/14/2014   CLINICAL DATA:  Acute onset of left lateral chest pain and left upper abdominal pain, after lifting heavy load of sheets. Initial encounter.  EXAM: ACUTE ABDOMEN SERIES (ABDOMEN 2 VIEW & CHEST 1 VIEW)  COMPARISON:  Chest radiograph performed 11/06/2012, and abdominal radiograph from 06/21/2010  FINDINGS: The lungs are well-aerated. Mild vascular congestion noted. There is no evidence of focal opacification, pleural effusion or pneumothorax. The cardiomediastinal silhouette is borderline enlarged.  The visualized bowel gas pattern is unremarkable. Scattered stool and air are seen within the colon; there is no evidence of small bowel dilatation to suggest obstruction. No free intra-abdominal air is identified on the provided upright view.  No acute osseous abnormalities are seen; the sacroiliac joints are unremarkable in appearance.  IMPRESSION: 1. Unremarkable bowel gas pattern; no free intra-abdominal air seen. 2. Mild vascular congestion and borderline cardiomegaly. Lungs remain grossly clear.   Electronically Signed   By: Roanna RaiderJeffery  Chang M.D.   On: 03/14/2014 23:01     EKG Interpretation None      MDM   Final diagnoses:  Abdominal pain   abdominal wall strain  Patient presents to the ER for evaluation of left-sided abdominal pain. Patient reported sudden onset of pain in the left mid abdominal region after lifting a heavy object and then throwing it. She reported a sharp pain and feeling like the area for and popped. Examination does not reveal any abdominal wall defect. No evidence of  hernia. Area is tender to the touch. X-ray does not show any evidence of obstruction. Patient reassured, will require analgesia and rest.   I personally performed the services described in this documentation, which was scribed in my presence. The recorded information has been reviewed and is accurate.       Gilda Creasehristopher J. Tabetha Haraway, MD 03/14/14 [phone]

## 2014-03-14 NOTE — ED Notes (Signed)
Pt was moving bins of laundry at work yesterday when she felt a pop and heard a pop in L. Lower abdominal area. She continued to feel discomfort in that area throughout rest of day but pain much worse today. States pain is same at rest as it is if she is moving.

## 2014-03-14 NOTE — Discharge Instructions (Signed)
Muscle Strain A muscle strain (pulled muscle) happens when a muscle is stretched beyond normal length. It happens when a sudden, violent force stretches your muscle too far. Usually, a few of the fibers in your muscle are torn. Muscle strain is common in athletes. Recovery usually takes 1-2 weeks. Complete healing takes 5-6 weeks.  HOME CARE   Follow the PRICE method of treatment to help your injury get better. Do this the first 2-3 days after the injury:  Protect. Protect the muscle to keep it from getting injured again.  Rest. Limit your activity and rest the injured body part.  Ice. Put ice in a plastic bag. Place a towel between your skin and the bag. Then, apply the ice and leave it on from 15-20 minutes each hour. After the third day, switch to moist heat packs.  Compression. Use a splint or elastic bandage on the injured area for comfort. Do not put it on too tightly.  Elevate. Keep the injured body part above the level of your heart.  Only take medicine as told by your doctor.  Warm up before doing exercise to prevent future muscle strains. GET HELP IF:   You have more pain or puffiness (swelling) in the injured area.  You feel numbness, tingling, or notice a loss of strength in the injured area. MAKE SURE YOU:   Understand these instructions.  Will watch your condition.  Will get help right away if you are not doing well or get worse. Document Released: 11/25/2007 Document Revised: 12/06/2012 Document Reviewed: 09/14/2012 [facility] Patient Information 2015 [place], [place]. This information is not intended to replace advice given to you by your health care provider. Make sure you discuss any questions you have with your health care provider.  Distensin muscular. (Muscle Strain) Una distensin muscular es una lesin que se produce cuando un msculo se estira ms all de su largo normal. Cuando esto sucede, por lo general se desgarra un pequeo nmero de fibras musculares.  La distensin muscular se califica en grados. Las distensiones de Museum/gallery conservator son aquellas en las cuales el desgarro y el dolor afectan a la menor cantidad de fibras musculares. Las distensiones de segundo y tercer grado involucran una proporcin cada vez mayor de desgarro y Engineer, mining.  En general, la recuperacin de una distensin muscular tarda de 1 a 2semanas. La curacin completa tarda de 5 a 6semanas.  CAUSAS  Las distensiones musculares ocurren cuando se aplica una fuerza violenta y repentina sobre un msculo y este se estira demasiado. Esto puede ocurrir cuando se Hydrographic surveyor, se practican deportes o en una cada.  FACTORES DE RIESGO La distensin muscular es especialmente comn en los atletas.  SIGNOS Y SNTOMAS En el lugar de la distensin muscular se puede presentar lo siguiente:  Dolor.  Moretones.  Hinchazn.  Dificultad para usar el msculo debido al dolor o a un funcionamiento anormal. DIAGNSTICO  El mdico le har un examen fsico y le har preguntas sobre sus antecedentes mdicos. TRATAMIENTO  Con frecuencia, el mejor tratamiento para una distensin muscular es el reposo, y la aplicacin de hielo y de compresas fras en la zona de la lesin.  INSTRUCCIONES PARA EL CUIDADO EN EL HOGAR   Use el mtodo PRICE (por sus siglas en ingls) de tratamiento para estimular la curacin durante los primeros 2 a 3das posteriores a la lesin. El mtodo PRICE implica lo siguiente:  Proteger al msculo de nuevas lesiones.  Limitar la actividad y Lawyer la parte del cuerpo lesionada.  Aplicar hielo a la lesin. Para hacerlo, ponga hielo en una bolsa plstica. Coloque una toalla entre la piel y la bolsa de hielo. Luego aplique el hielo y djelo actuar de 15 a 20minutos por hora. Despus del Press photographertercer da, cambie a compresas de calor hmedo.  Comprimir la zona lesionada con una frula o venda elstica. Tenga cuidado de no ajustarla demasiado. Esto puede interferir con la circulacin  sangunea o aumentar la hinchazn.  Mantener la zona lesionada por encima del nivel del corazn con la mayor frecuencia posible.  Utilice los medicamentos de venta libre o recetados para Primary school teachercalmar el dolor, el malestar o la fiebre, segn se lo indique el mdico.  Education officer, environmentalealizar un calentamiento antes de hacer ejercicio ayuda a prevenir distensiones musculares futuras. SOLICITE ATENCIN MDICA SI:   Siente un dolor cada vez ms intenso o hinchazn en la zona lesionada.  Siente adormecimiento, hormigueo o nota una prdida importante de fuerza en la zona lesionada. ASEGRESE DE QUE:   Comprende estas instrucciones.  Controlar su afeccin.  Recibir ayuda de inmediato si no mejora o si empeora. Document Released: 11/25/2004 Document Revised: 12/06/2012 [facility] Patient Information 2015 [place]. This information is not intended to replace advice given to you by your health care provider. Make sure you discuss any questions you have with your health care provider.

## 2014-04-09 ENCOUNTER — Encounter (HOSPITAL_COMMUNITY): Payer: Self-pay | Admitting: Emergency Medicine

## 2014-04-09 ENCOUNTER — Emergency Department (HOSPITAL_COMMUNITY)
Admission: EM | Admit: 2014-04-09 | Discharge: 2014-04-09 | Disposition: A | Discharge status: Home / Self Care | Payer: Self-pay | Attending: Emergency Medicine | Admitting: Emergency Medicine

## 2014-04-09 ENCOUNTER — Emergency Department (HOSPITAL_COMMUNITY): Payer: Self-pay

## 2014-04-09 DIAGNOSIS — Z862 Personal history of diseases of the blood and blood-forming organs and certain disorders involving the immune mechanism: Secondary | ICD-10-CM | POA: Insufficient documentation

## 2014-04-09 DIAGNOSIS — R002 Palpitations: Secondary | ICD-10-CM | POA: Insufficient documentation

## 2014-04-09 DIAGNOSIS — I10 Essential (primary) hypertension: Secondary | ICD-10-CM | POA: Insufficient documentation

## 2014-04-09 DIAGNOSIS — R42 Dizziness and giddiness: Secondary | ICD-10-CM | POA: Insufficient documentation

## 2014-04-09 DIAGNOSIS — Z7951 Long term (current) use of inhaled steroids: Secondary | ICD-10-CM | POA: Insufficient documentation

## 2014-04-09 DIAGNOSIS — Z79899 Other long term (current) drug therapy: Secondary | ICD-10-CM | POA: Insufficient documentation

## 2014-04-09 DIAGNOSIS — R519 Headache, unspecified: Secondary | ICD-10-CM

## 2014-04-09 DIAGNOSIS — R51 Headache: Secondary | ICD-10-CM | POA: Insufficient documentation

## 2014-04-09 DIAGNOSIS — J45909 Unspecified asthma, uncomplicated: Secondary | ICD-10-CM | POA: Insufficient documentation

## 2014-04-09 LAB — BASIC METABOLIC PANEL
ANION GAP: 5 (ref 5–15)
BUN: 17 mg/dL (ref 6–23)
CALCIUM: 11 mg/dL — AB (ref 8.4–10.5)
CHLORIDE: 109 mmol/L (ref 96–112)
CO2: 26 mmol/L (ref 19–32)
Creatinine, Ser: 0.53 mg/dL (ref 0.50–1.10)
GFR calc Af Amer: >90 mL/min (ref >90)
GFR calc non Af Amer: >90 mL/min (ref >90)
GLUCOSE: 105 mg/dL — AB (ref 70–99)
POTASSIUM: 3.6 mmol/L (ref 3.5–5.1)
SODIUM: 140 mmol/L (ref 135–145)

## 2014-04-09 LAB — CBC WITH DIFFERENTIAL/PLATELET
Basophils Absolute: 0 10*3/uL (ref 0.0–0.1)
Basophils Relative: 0 % (ref 0–1)
EOS ABS: 0.2 10*3/uL (ref 0.0–0.7)
EOS PCT: 2 % (ref 0–5)
HEMATOCRIT: 42.3 % (ref 36.0–46.0)
Hemoglobin: 13.5 g/dL (ref 12.0–15.0)
LYMPHS PCT: 41 % (ref 12–46)
Lymphs Abs: 3.8 10*3/uL (ref 0.7–4.0)
MCH: 28.9 pg (ref 26.0–34.0)
MCHC: 31.9 g/dL (ref 30.0–36.0)
MCV: 90.6 fL (ref 78.0–100.0)
MONO ABS: 0.7 10*3/uL (ref 0.1–1.0)
Monocytes Relative: 8 % (ref 3–12)
Neutro Abs: 4.4 10*3/uL (ref 1.7–7.7)
Neutrophils Relative %: 49 % (ref 43–77)
PLATELETS: 346 10*3/uL (ref 150–400)
RBC: 4.67 MIL/uL (ref 3.87–5.11)
RDW: 13.9 % (ref 11.5–15.5)
WBC: 9.1 10*3/uL (ref 4.0–10.5)

## 2014-04-09 LAB — PROTIME-INR
INR: 0.97 (ref 0.00–1.49)
PROTHROMBIN TIME: 13 s (ref 11.6–15.2)

## 2014-04-09 MED ORDER — IOHEXOL 350 MG/ML SOLN
80.0000 mL | Freq: Once | INTRAVENOUS | Status: AC | PRN
  Administered 2014-04-09: 80 mL via INTRAVENOUS

## 2014-04-09 MED ORDER — MORPHINE SULFATE 2 MG/ML IJ SOLN
2.0000 mg | Freq: Once | INTRAMUSCULAR | Status: AC
  Administered 2014-04-09: 2 mg via INTRAVENOUS
  Filled 2014-04-09: qty 1

## 2014-04-09 NOTE — Discharge Instructions (Signed)
General Headache Without Cause  A general headache is pain or discomfort felt around the head or neck area. The cause may not be found.   HOME CARE   · Keep all doctor visits.  · Only take medicines as told by your doctor.  · Lie down in a dark, quiet room when you have a headache.  · Keep a journal to find out if certain things bring on headaches. For example, write down:  ¨ What you eat and drink.  ¨ How much sleep you get.  ¨ Any change to your diet or medicines.  · Relax by getting a massage or doing other relaxing activities.  · Put ice or heat packs on the head and neck area as told by your doctor.  · Lessen stress.  · Sit up straight. Do not tighten (tense) your muscles.  · Quit smoking if you smoke.  · Lessen how much alcohol you drink.  · Lessen how much caffeine you drink, or stop drinking caffeine.  · Eat and sleep on a regular schedule.  · Get 7 to 9 hours of sleep, or as told by your doctor.  · Keep lights dim if bright lights bother you or make your headaches worse.  GET HELP RIGHT AWAY IF:   · Your headache becomes really bad.  · You have a fever.  · You have a stiff neck.  · You have trouble seeing.  · Your muscles are weak, or you lose muscle control.  · You lose your balance or have trouble walking.  · You feel like you will pass out (faint), or you pass out.  · You have really bad symptoms that are different than your first symptoms.  · You have problems with the medicines given to you by your doctor.  · Your medicines do not work.  · Your headache feels different than the other headaches.  · You feel sick to your stomach (nauseous) or throw up (vomit).  MAKE SURE YOU:   · Understand these instructions.  · Will watch your condition.  · Will get help right away if you are not doing well or get worse.  Document Released: 11/25/2007 Document Revised: 05/10/2011 Document Reviewed: 02/05/2011  ExitCare® Patient Information ©2015 [employer]. This information is not intended to replace advice given to  you by your health care provider. Make sure you discuss any questions you have with your health care provider.

## 2014-04-09 NOTE — ED Provider Notes (Signed)
CSN: [id number]     Arrival date & time 04/09/14  1648 History  This chart was scribed for Hilario Quarryanielle S Ravinder Hofland, MD by Richarda Overlieichard Holland, ED Scribe. This patient was seen in room APA07/APA07 and the patient's care was started 5:36 PM.    Chief Complaint  Patient presents with  . Headache   Patient is a 54 y.o. female presenting with headaches. The history is provided by the patient. No language interpreter was used.  Headache Pain location:  R parietal Severity currently:  6/10 Severity at highest:  10/10 Onset quality:  Gradual Duration:  10 hours Timing:  Constant Progression:  Improving Chronicity:  New Similar to prior headaches: no   Relieved by:  NSAIDs  HPI Comments: Lamont DowdyMarcelina B Sferrazza is a 54 y.o. female with a history of asthma, HTN and anemia who presents to the Emergency Department complaining of right sided HA that started at 8AM this morning. She says that when she was working and the HA onset was gradual over 30 minutes. She describes the pain as a pressure and rates it as 6/10 at this time and as a 10/10 earlier today. She states she has never had any similar prior headaches. Pt reports that she has taken an aspirin and advil today which provided minimal relief to her symptoms. Pt also reports palpitations and lightheadedness. She denies visual changes or any weakness. Pt reports no modifying factors at this time.   Past Medical History  Diagnosis Date  . Asthma   . Hypertension   . Anemia    Past Surgical History  Procedure Laterality Date  . Cesarean section    . Tubal ligation    . Knee arthroscopy with medial menisectomy  01/12/2012    Procedure: KNEE ARTHROSCOPY WITH MEDIAL MENISECTOMY;  Surgeon: Nestor LewandowskyFrank J Rowan, MD;  Location: [facility];  Service: Orthopedics;  Laterality: Right;  PARTIAL MEDIAL MENISECTOMY, chondroplasty   Family History  Problem Relation Age of Onset  . Cancer Sister   . Cancer Other    History  Substance Use Topics  . Smoking  status: Never Smoker   . Smokeless tobacco: Never Used  . Alcohol Use: No   OB History    Gravida Para Term Preterm AB TAB SAB Ectopic Multiple Living   9 7 7  2  2         Review of Systems  Neurological: Positive for light-headedness and headaches.  All other systems reviewed and are negative.   Allergies  Review of patient's allergies indicates no known allergies.  Home Medications   Prior to Admission medications   Medication Sig Start Date End Date Taking? Authorizing Provider  albuterol (PROVENTIL HFA;VENTOLIN HFA) 108 (90 BASE) MCG/ACT inhaler Inhale 2 puffs into the lungs every 6 (six) hours as needed.    Historical Provider, MD  fluticasone (VERAMYST) 27.5 MCG/SPRAY nasal spray Place 1 spray into the nose 2 (two) times daily.    Historical Provider, MD  HYDROcodone-acetaminophen (NORCO/VICODIN) 5-325 MG per tablet Take 2 tablets by mouth every 4 (four) hours as needed for moderate pain. 03/14/14   Gilda Creasehristopher J. Pollina, MD  ibuprofen (ADVIL,MOTRIN) 200 MG tablet Take 200 mg by mouth every 6 (six) hours as needed.    Historical Provider, MD  lisinopril (PRINIVIL,ZESTRIL) 10 MG tablet Take 10 mg by mouth daily.    Historical Provider, MD   BP 183/100 mmHg  Pulse 81  Temp(Src) 98.2 F (36.8 C) (Oral)  Resp 22  Ht 5\' 1"  (1.549 m)  Wt 230 lb (104.327 kg)  BMI 43.48 kg/m2  SpO2 100% Physical Exam  Constitutional: She is oriented to person, place, and time. She appears well-developed and well-nourished.  Gait normal  HENT:  Head: Normocephalic and atraumatic.  Right Ear: Tympanic membrane and external ear normal.  Left Ear: Tympanic membrane and external ear normal.  Nose: Nose normal. Right sinus exhibits no maxillary sinus tenderness and no frontal sinus tenderness. Left sinus exhibits no maxillary sinus tenderness and no frontal sinus tenderness.  Eyes: Conjunctivae and EOM are normal. Pupils are equal, round, and reactive to light. Right eye exhibits no discharge.  Left eye exhibits no discharge. Right eye exhibits no nystagmus. Left eye exhibits no nystagmus.  Neck: Normal range of motion. Neck supple. No tracheal deviation present.  Cardiovascular: Normal rate, regular rhythm, normal heart sounds and intact distal pulses.   Pulmonary/Chest: Effort normal and breath sounds normal. No respiratory distress. She exhibits no tenderness.  Abdominal: Soft. Bowel sounds are normal. She exhibits no distension and no mass. There is no tenderness.  Musculoskeletal: Normal range of motion. She exhibits no edema or tenderness.  Neurological: She is alert and oriented to person, place, and time. She has normal strength and normal reflexes. No sensory deficit. She displays a negative Romberg sign. GCS eye subscore is 4. GCS verbal subscore is 5. GCS motor subscore is 6.  Reflex Scores:      Tricep reflexes are 2+ on the right side and 2+ on the left side.      Bicep reflexes are 2+ on the right side and 2+ on the left side.      Brachioradialis reflexes are 2+ on the right side and 2+ on the left side.      Patellar reflexes are 2+ on the right side and 2+ on the left side.      Achilles reflexes are 2+ on the right side and 2+ on the left side. No palmar drift.   Skin: Skin is warm and dry. No rash noted.  Psychiatric: She has a normal mood and affect. Her behavior is normal. Judgment and thought content normal.  Nursing note and vitals reviewed.   ED Course  Procedures   DIAGNOSTIC STUDIES: Oxygen Saturation is 100% on RA, normal by my interpretation.    COORDINATION OF CARE: 5:39 PM Discussed treatment plan with pt at bedside and pt agreed to plan.   Labs Review Labs Reviewed  BASIC METABOLIC PANEL - Abnormal; Notable for the following:    Glucose, Bld 105 (*)    Calcium 11.0 (*)    All other components within normal limits  CBC WITH DIFFERENTIAL/PLATELET  PROTIME-INR    Imaging Review Ct Angio Head W/cm &/or Wo Cm  04/09/2014   CLINICAL DATA:   Headache  EXAM: CT ANGIOGRAPHY HEAD  TECHNIQUE: Multidetector CT imaging of the head was performed using the standard protocol during bolus administration of intravenous contrast. Multiplanar CT image reconstructions and MIPs were obtained to evaluate the vascular anatomy.  CONTRAST:  80mL OMNIPAQUE IOHEXOL 350 MG/ML SOLN  COMPARISON:  CT head 04/09/2014  FINDINGS: CT HEAD  Brain: Ventricle size is normal. Negative for acute infarct. Negative for hemorrhage or mass.  Calvarium and skull base: Negative  Paranasal sinuses: Mucosal edema in the paranasal sinuses.  Orbits: Negative  CTA HEAD  Anterior circulation: Internal carotid artery widely patent bilaterally without stenosis. Anterior and middle cerebral arteries widely patent bilaterally.  Posterior circulation: Both vertebral arteries are patent to the basilar. PICA  patent bilaterally. Basilar widely patent. Superior cerebellar and posterior cerebral arteries are patent bilaterally. Fetal origin of the left posterior cerebral artery. Hypoplastic left P1 segment.  Venous sinuses: Patent without occlusion or thrombus.  Anatomic variants: None  Delayed phase:Normal enhancement.  Negative for cerebral aneurysm.  IMPRESSION: Normal CTA head.   Electronically Signed   By: Marlan Palau M.D.   On: 04/09/2014 19:47   Ct Head Wo Contrast  04/09/2014   CLINICAL DATA:  Headache  EXAM: CT HEAD WITHOUT CONTRAST  TECHNIQUE: Contiguous axial images were obtained from the base of the skull through the vertex without intravenous contrast.  COMPARISON:  None.  FINDINGS: Bony calvarium appears intact. No mass effect or midline shift is noted. Ventricular size is within normal limits. There is no evidence of mass lesion, hemorrhage or acute infarction.  IMPRESSION: Normal head CT.   Electronically Signed   By: Lupita Raider, M.D.   On: 04/09/2014 18:46     EKG Interpretation None      MDM   Final diagnoses:  Headache    54 year old female yesterday complaining of  sudden onset of headache today. She has a normal neurological exam. CT was negative. Due to body habitus, I feel that it would be unlikely that I would obtain a clean lumbar puncture to assess for blood. Therefore, a CT angiogram was obtained and this is normal. Given her normal neurological exam and normal CT angiogram, I have a low index of suspicion for subarachnoid hemorrhage. Patient is instructed regarding return precautions and need for close follow-up. She voices understanding.  I personally performed the services described in this documentation, which was scribed in my presence. The recorded information has been reviewed and considered.   Hilario Quarry, MD 04/09/14 [id number]

## 2014-04-09 NOTE — ED Notes (Signed)
Pt alert & oriented x4, stable gait. Patient  given discharge instructions, paperwork & prescription(s). Patient verbalized understanding. Pt left department w/ no further questions. 

## 2014-04-09 NOTE — ED Notes (Signed)
Pt c/o onset of R sided headache. No hx of headaches. Pt also reports palpations and lightheadedness.

## 2014-04-09 NOTE — ED Notes (Signed)
Family member translating. States pt is feeling better. Rating headache a 5/10 at this time.

## 2014-04-09 NOTE — ED Notes (Signed)
MD at bedside. 

## 2015-04-24 ENCOUNTER — Emergency Department (HOSPITAL_COMMUNITY)
Admission: EM | Admit: 2015-04-24 | Discharge: 2015-04-24 | Discharge status: Against Medical Advice | Payer: Self-pay | Attending: Emergency Medicine | Admitting: Emergency Medicine

## 2015-04-24 ENCOUNTER — Encounter (HOSPITAL_COMMUNITY): Payer: Self-pay | Admitting: Emergency Medicine

## 2015-04-24 DIAGNOSIS — I10 Essential (primary) hypertension: Secondary | ICD-10-CM | POA: Insufficient documentation

## 2015-04-24 DIAGNOSIS — Z79899 Other long term (current) drug therapy: Secondary | ICD-10-CM | POA: Insufficient documentation

## 2015-04-24 DIAGNOSIS — J45909 Unspecified asthma, uncomplicated: Secondary | ICD-10-CM | POA: Insufficient documentation

## 2015-04-24 DIAGNOSIS — R42 Dizziness and giddiness: Secondary | ICD-10-CM | POA: Insufficient documentation

## 2015-04-24 LAB — URINALYSIS, ROUTINE W REFLEX MICROSCOPIC
Bilirubin Urine: NEGATIVE
Glucose, UA: NEGATIVE mg/dL
Hgb urine dipstick: NEGATIVE
KETONES UR: NEGATIVE mg/dL
LEUKOCYTES UA: NEGATIVE
NITRITE: NEGATIVE
PH: 6 (ref 5.0–8.0)
Protein, ur: NEGATIVE mg/dL
SPECIFIC GRAVITY, URINE: 1.02 (ref 1.005–1.030)

## 2015-04-24 LAB — CBC WITH DIFFERENTIAL/PLATELET
BASOS PCT: 0 %
Basophils Absolute: 0 10*3/uL (ref 0.0–0.1)
EOS ABS: 0.4 10*3/uL (ref 0.0–0.7)
Eosinophils Relative: 4 %
HEMATOCRIT: 45.1 % (ref 36.0–46.0)
Hemoglobin: 14.6 g/dL (ref 12.0–15.0)
LYMPHS ABS: 4.5 10*3/uL — AB (ref 0.7–4.0)
Lymphocytes Relative: 46 %
MCH: 29.4 pg (ref 26.0–34.0)
MCHC: 32.4 g/dL (ref 30.0–36.0)
MCV: 90.7 fL (ref 78.0–100.0)
MONO ABS: 0.7 10*3/uL (ref 0.1–1.0)
MONOS PCT: 7 %
NEUTROS ABS: 4.3 10*3/uL (ref 1.7–7.7)
Neutrophils Relative %: 43 %
Platelets: 304 10*3/uL (ref 150–400)
RBC: 4.97 MIL/uL (ref 3.87–5.11)
RDW: 13.7 % (ref 11.5–15.5)
WBC: 9.9 10*3/uL (ref 4.0–10.5)

## 2015-04-24 LAB — COMPREHENSIVE METABOLIC PANEL
ALBUMIN: 4.2 g/dL (ref 3.5–5.0)
ALK PHOS: 81 U/L (ref 38–126)
ALT: 42 U/L (ref 14–54)
ANION GAP: 8 (ref 5–15)
AST: 26 U/L (ref 15–41)
BILIRUBIN TOTAL: 0.3 mg/dL (ref 0.3–1.2)
BUN: 13 mg/dL (ref 6–20)
CO2: 29 mmol/L (ref 22–32)
Calcium: 10.7 mg/dL — ABNORMAL HIGH (ref 8.9–10.3)
Chloride: 102 mmol/L (ref 101–111)
Creatinine, Ser: 0.85 mg/dL (ref 0.44–1.00)
GFR calc non Af Amer: >60 mL/min (ref >60)
Glucose, Bld: 105 mg/dL — ABNORMAL HIGH (ref 65–99)
POTASSIUM: 4.2 mmol/L (ref 3.5–5.1)
Sodium: 139 mmol/L (ref 135–145)
Total Protein: 8 g/dL (ref 6.5–8.1)

## 2015-04-24 NOTE — ED Notes (Signed)
Pt was sent from health dept, reports low bp, diarrhea x4 days .  Pt dx with orthostatic hypotension 157/93 laying, 135/87 sitting.  Pt alert and oriented.

## 2015-04-24 NOTE — ED Provider Notes (Signed)
CSN: [id number]     Arrival date & time 04/24/15  1816 History  By signing my name below, I, Jenny Gregory, attest that this documentation has been prepared under the direction and in the presence of Jenny Mulders, MD . Electronically Signed: [name], Scribe. 04/24/2015. 9:25 PM.    Chief Complaint  Patient presents with  . Dizziness    Patient is a 55 y.o. female presenting with dizziness. The history is provided by the patient and a relative. No language interpreter was used.  Dizziness Quality:  Lightheadedness Severity:  Unable to specify Onset quality:  Sudden Duration:  1 day Timing:  Unable to specify Progression:  Unable to specify Chronicity:  New Context: standing up   Context: not with loss of consciousness   Relieved by:  None tried Worsened by:  Standing up Ineffective treatments:  None tried Associated symptoms: diarrhea and headaches   Associated symptoms: no blood in stool, no chest pain, no nausea, no shortness of breath, no syncope, no vision changes and no vomiting   Diarrhea:    Quality:  Unable to specify   Severity:  Unable to specify   Duration:  4 days   Timing:  Unable to specify   Progression:  Resolved Headaches:    Severity:  Unable to specify   Onset quality:  Sudden   Timing:  Unable to specify   Progression:  Unable to specify   Chronicity:  New    HPI Comments: Jenny Gregory is a 55 y.o. female with h/o HTN who presents to the Emergency Department complaining of sudden onset dizziness since earlier today. Pt states that she has been sick for the last four days; she has had diarrhea but is not experiencing diarrhea currently. She states that she went to the health department earlier today because she felt dizzy; she was sent to the ER because she needed fluids. She states that she currently feels dizzy and has had trouble standing. Pt reports associated lightheadedness and headaches. Pt denies LOC, nausea, vomiting, myalgias, or  any other associated symptoms at this time.    Past Medical History  Diagnosis Date  . Asthma   . Hypertension   . Anemia    Past Surgical History  Procedure Laterality Date  . Cesarean section    . Tubal ligation    . Knee arthroscopy with medial menisectomy  01/12/2012    Procedure: KNEE ARTHROSCOPY WITH MEDIAL MENISECTOMY;  Surgeon: Nestor Lewandowsky, MD;  Location: [facility];  Service: Orthopedics;  Laterality: Right;  PARTIAL MEDIAL MENISECTOMY, chondroplasty   Family History  Problem Relation Age of Onset  . Cancer Sister   . Cancer Other    Social History  Substance Use Topics  . Smoking status: Never Smoker   . Smokeless tobacco: Never Used  . Alcohol Use: No   OB History    Gravida Para Term Preterm AB TAB SAB Ectopic Multiple Living   9 7 7  2  2         Review of Systems  Constitutional: Negative for fever.  HENT: Negative for rhinorrhea and sore throat.   Eyes: Negative for visual disturbance.  Respiratory: Negative for cough and shortness of breath.   Cardiovascular: Negative for chest pain and syncope.  Gastrointestinal: Positive for diarrhea. Negative for nausea, vomiting, abdominal pain and blood in stool.       No hematochezia  Genitourinary: Negative for dysuria.  Musculoskeletal: Negative for myalgias, back pain and joint swelling.  Skin: Negative for rash.  Neurological: Positive for dizziness, light-headedness and headaches. Negative for syncope.  Hematological: Does not bruise/bleed easily.  Psychiatric/Behavioral: Negative for confusion.      Allergies  Review of patient's allergies indicates no known allergies.  Home Medications   Prior to Admission medications   Medication Sig Start Date End Date Taking? Authorizing Provider  albuterol (PROVENTIL HFA;VENTOLIN HFA) 108 (90 BASE) MCG/ACT inhaler Inhale 2 puffs into the lungs every 6 (six) hours as needed for wheezing or shortness of breath.    Yes Historical Provider, MD   hydrOXYzine (VISTARIL) 50 MG capsule Take 1 capsule by mouth every 8 (eight) hours as needed. For 03/31/15  Yes Historical Provider, MD  ibuprofen (ADVIL,MOTRIN) 200 MG tablet Take 200 mg by mouth every 6 (six) hours as needed for mild pain or moderate pain.    Yes Historical Provider, MD  lisinopril (PRINIVIL,ZESTRIL) 10 MG tablet Take 10 mg by mouth at bedtime.     Historical Provider, MD   BP 136/70 mmHg  Pulse 69  Temp(Src) 98.8 F (37.1 C)  Resp 20  Ht  (1.549 m)  Wt 101.152 kg  BMI 42.16 kg/m2  SpO2 97% Physical Exam  Constitutional: She is oriented to person, place, and time. She appears well-developed and well-nourished. No distress.  HENT:  Head: Normocephalic and atraumatic.  Mouth/Throat: Oropharynx is clear and moist and mucous membranes are normal.  Eyes: Conjunctivae and EOM are normal. Pupils are equal, round, and reactive to light. No scleral icterus.  Neck: Neck supple. No tracheal deviation present.  Cardiovascular: Normal rate and regular rhythm.   Pulmonary/Chest: Effort normal and breath sounds normal. No respiratory distress.  Abdominal: Bowel sounds are normal. There is no tenderness.  Musculoskeletal: Normal range of motion.  No edema bilateral ankles  Neurological: She is alert and oriented to person, place, and time.  Skin: Skin is warm and dry.  Psychiatric: She has a normal mood and affect. Her behavior is normal.  Nursing note and vitals reviewed.   ED Course  Procedures (including critical care time)  DIAGNOSTIC STUDIES: Oxygen Saturation is 97% on RA, normal by my interpretation.    COORDINATION OF CARE: 9:25 PM Discussed treatment plan with pt at bedside and pt agreed to plan.  Results for orders placed or performed during the hospital encounter of 04/24/15  CBC with Differential  Result Value Ref Range   WBC 9.9 4.0 - 10.5 K/uL   RBC 4.97 3.87 - 5.11 MIL/uL   Hemoglobin 14.6 12.0 - 15.0 g/dL   HCT 16.1 09.6 - 04.5 %   MCV 90.7 78.0  - 100.0 fL   MCH 29.4 26.0 - 34.0 pg   MCHC 32.4 30.0 - 36.0 g/dL   RDW 40.9 81.1 - 91.4 %   Platelets 304 150 - 400 K/uL   Neutrophils Relative % 43 %   Neutro Abs 4.3 1.7 - 7.7 K/uL   Lymphocytes Relative 46 %   Lymphs Abs 4.5 (H) 0.7 - 4.0 K/uL   Monocytes Relative 7 %   Monocytes Absolute 0.7 0.1 - 1.0 K/uL   Eosinophils Relative 4 %   Eosinophils Absolute 0.4 0.0 - 0.7 K/uL   Basophils Relative 0 %   Basophils Absolute 0.0 0.0 - 0.1 K/uL  Comprehensive metabolic panel  Result Value Ref Range   Sodium 139 135 - 145 mmol/L   Potassium 4.2 3.5 - 5.1 mmol/L   Chloride 102 101 - 111 mmol/L   CO2 29 22 -  32 mmol/L   Glucose, Bld 105 (H) 65 - 99 mg/dL   BUN 13 6 - 20 mg/dL   Creatinine, Ser 1.61 0.44 - 1.00 mg/dL   Calcium 09.6 (H) 8.9 - 10.3 mg/dL   Total Protein 8.0 6.5 - 8.1 g/dL   Albumin 4.2 3.5 - 5.0 g/dL   AST 26 15 - 41 U/L   ALT 42 14 - 54 U/L   Alkaline Phosphatase 81 38 - 126 U/L   Total Bilirubin 0.3 0.3 - 1.2 mg/dL   GFR calc non Af Amer >60 >60 mL/min   GFR calc Af Amer >60 >60 mL/min   Anion gap 8 5 - 15  Urinalysis, Routine w reflex microscopic (not at [facility])  Result Value Ref Range   Color, Urine YELLOW YELLOW   APPearance CLEAR CLEAR   Specific Gravity, Urine 1.020 1.005 - 1.030   pH 6.0 5.0 - 8.0   Glucose, UA NEGATIVE NEGATIVE mg/dL   Hgb urine dipstick NEGATIVE NEGATIVE   Bilirubin Urine NEGATIVE NEGATIVE   Ketones, ur NEGATIVE NEGATIVE mg/dL   Protein, ur NEGATIVE NEGATIVE mg/dL   Nitrite NEGATIVE NEGATIVE   Leukocytes, UA NEGATIVE NEGATIVE   No results found.  Medications - No data to display     EKG Interpretation None      MDM   Final diagnoses:  Dizziness   Patient sent in from the health department. Patient asymptomatic here. Patient has had a diarrhea illness but that is now cleared up. Patient's labs without any significant abnormalities. No evidence of any significant dehydration. Patient left prior to labs being back.  I  personally performed the services described in this documentation, which was scribed in my presence. The recorded information has been reviewed and is accurate.        Jenny Mulders, MD 04/24/15 2308

## 2015-04-24 NOTE — ED Notes (Signed)
Orthostatics VS completed. Pt denies dizziness at this time.

## 2015-04-24 NOTE — ED Notes (Signed)
Pt left department with family. Stating they did not want to wait any longer and wanted to get the children home.  Refused to sign AMA.

## 2016-01-02 IMAGING — CT CT ANGIO HEAD
1 of 9 series · 6 of 47 positions shown · IV contrast (Omnipaque 300)
Comparison: CT head 04/09/2014

CLINICAL DATA: Headache

EXAM:
CT ANGIOGRAPHY HEAD
TECHNIQUE: Multidetector CT imaging of the head was performed using the
standard protocol during bolus administration of intravenous
contrast. Multiplanar CT image reconstructions and MIPs were
obtained to evaluate the vascular anatomy.
CONTRAST:  80mL OMNIPAQUE IOHEXOL 350 MG/ML SOLN

[Series 4: brain angio 2.0 pacs · axial · 0.41mm/px · z∈[+200,+308]mm · 6 of 76 slices shown]
[im 11/76  brain]
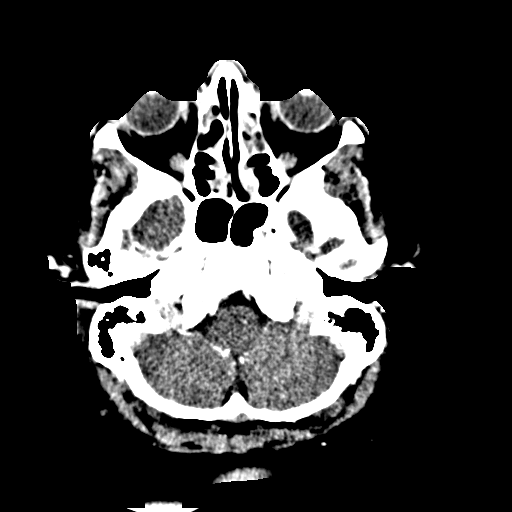
[im 22/76  bone]
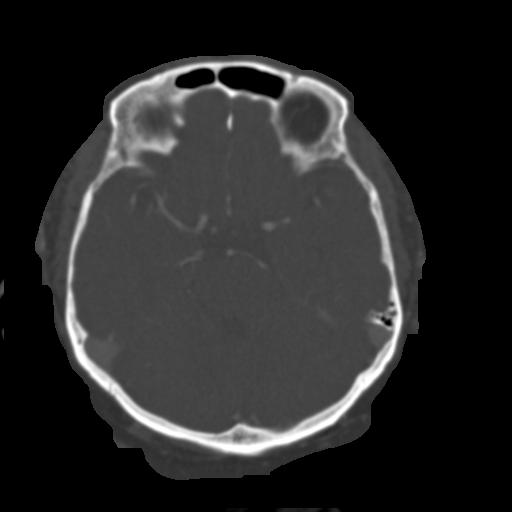
[im 33/76  brain]
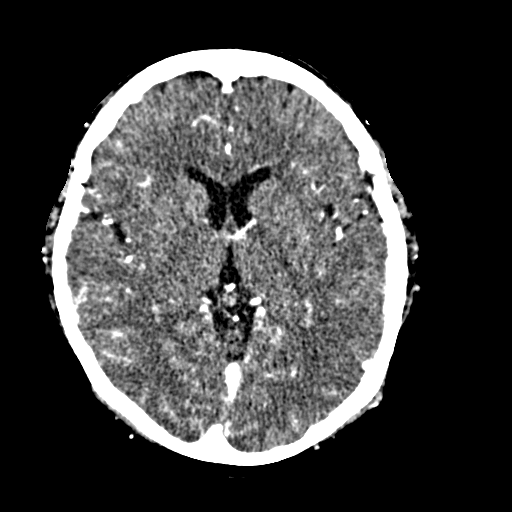
[im 43/76  bone]
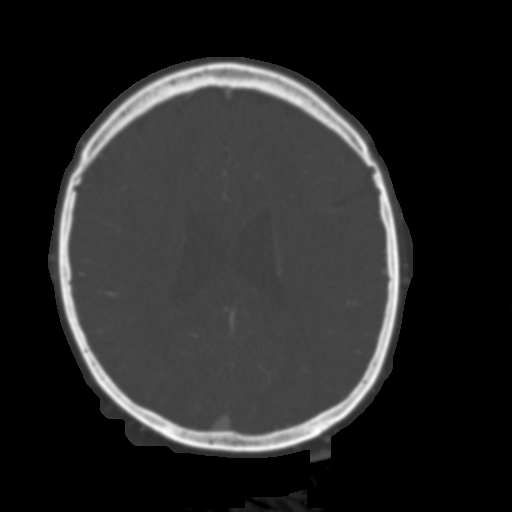
[im 54/76  brain]
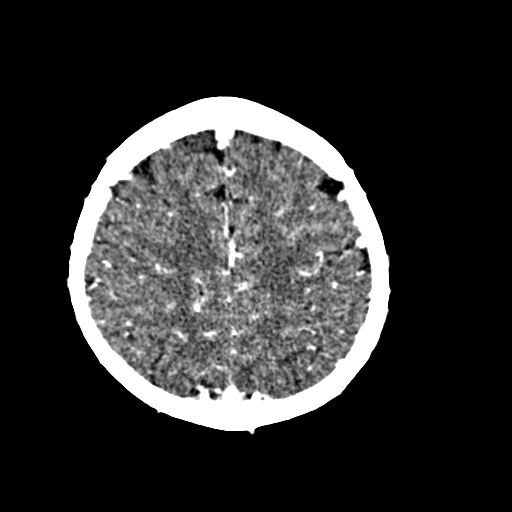
[im 65/76  bone]
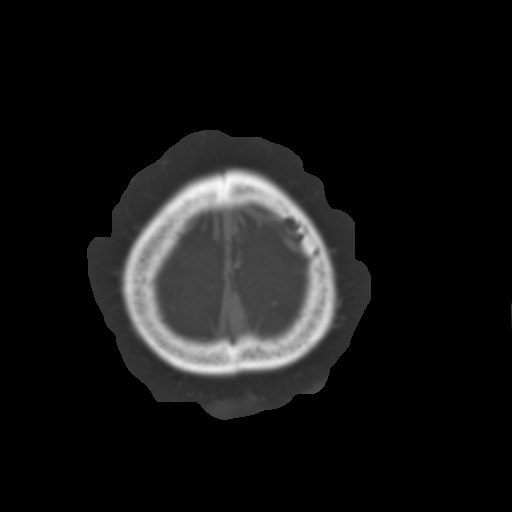

[6 of 47 positions shown; findings below may reference images not displayed]

FINDINGS: CT HEAD

Brain: Ventricle size is normal. Negative for acute infarct.
Negative for hemorrhage or mass.

Calvarium and skull base: Negative

Paranasal sinuses: Mucosal edema in the paranasal sinuses.

Orbits: Negative

CTA HEAD

Anterior circulation: Internal carotid artery widely patent
bilaterally without stenosis. Anterior and middle cerebral arteries
widely patent bilaterally.

Posterior circulation: Both vertebral arteries are patent to the
basilar. PICA patent bilaterally. Basilar widely patent. Superior
cerebellar and posterior cerebral arteries are patent bilaterally.
Fetal origin of the left posterior cerebral artery. Hypoplastic left
P1 segment.

Venous sinuses: Patent without occlusion or thrombus.

Anatomic variants: None

Delayed phase:Normal enhancement.

Negative for cerebral aneurysm.
IMPRESSION: Normal CTA head.

## 2017-06-10 ENCOUNTER — Other Ambulatory Visit: Payer: Self-pay | Admitting: Obstetrics and Gynecology

## 2017-08-15 ENCOUNTER — Ambulatory Visit: Payer: Self-pay

## 2017-08-17 ENCOUNTER — Ambulatory Visit
Admission: RE | Admit: 2017-08-17 | Discharge: 2017-08-17 | Disposition: A | Discharge status: Home / Self Care | Payer: Self-pay | Source: Ambulatory Visit | Attending: Oncology | Admitting: Oncology

## 2017-08-17 ENCOUNTER — Ambulatory Visit: Payer: Self-pay | Attending: Oncology

## 2017-08-17 ENCOUNTER — Encounter (INDEPENDENT_AMBULATORY_CARE_PROVIDER_SITE_OTHER): Payer: Self-pay

## 2017-08-17 VITALS — BP 136/83 | HR 79 | Temp 97.4°F | Resp 18 | Ht 60.0 in | Wt 222.0 lb

## 2017-08-17 DIAGNOSIS — Z0000 Encounter for general adult medical examination without abnormal findings: Secondary | ICD-10-CM

## 2017-08-17 NOTE — Progress Notes (Signed)
  Subjective:     Patient ID: Jenny Gregory, female   DOB: [birth year], 57 y.o.   MRN: [id number]  HPI   Review of Systems     Objective:   Physical Exam  Pulmonary/Chest: Right breast exhibits no inverted nipple, no mass, no nipple discharge, no skin change and no tenderness. Left breast exhibits no inverted nipple, no mass, no nipple discharge, no skin change and no tenderness. Breasts are symmetrical.  Bilateral flat nipples       Assessment:     [age] year old  hispanic patient presents for [facility] clinic visit.  Jaqui Laukaitis interpreted exam.  Patient screened, and meets [facility] eligibility.  Patient does not have insurance, Medicare or Medicaid.  Handout given on Affordable Care Act.  Instructed patient on breast self awareness using teach back method.  Clinical breast exam unremarkable.  No mass or lump palpated.  Patient had a Birads 1 mammogram 08/19/16.  Results in Care Everywhere.    Plan:     Sent for bilateral screening mammogram.

## 2017-08-23 ENCOUNTER — Inpatient Hospital Stay
Admission: RE | Admit: 2017-08-23 | Discharge: 2017-08-23 | Disposition: A | Discharge status: Home / Self Care | Payer: Self-pay | Source: Ambulatory Visit | Attending: *Deleted | Admitting: *Deleted

## 2017-08-23 ENCOUNTER — Other Ambulatory Visit: Payer: Self-pay | Admitting: *Deleted

## 2017-08-23 DIAGNOSIS — Z9289 Personal history of other medical treatment: Secondary | ICD-10-CM

## 2017-08-24 NOTE — Progress Notes (Signed)
Letter mailed from [facility] to notify of normal mammogram results.  Patient to return in one year for annual screening.  Copy to [facility]. 

## 2019-05-10 ENCOUNTER — Ambulatory Visit: Payer: Self-pay

## 2019-05-11 ENCOUNTER — Ambulatory Visit: Payer: Self-pay | Attending: Internal Medicine

## 2019-05-11 DIAGNOSIS — Z23 Encounter for immunization: Secondary | ICD-10-CM

## 2019-05-11 NOTE — Progress Notes (Signed)
   Covid-19 Vaccination Clinic  Name:  Jenny Gregory    MRN: [id number] DOB: Oct 08, 1960  05/11/2019  [name] was observed post Covid-19 immunization for 15 minutes without incident. She was provided with Vaccine Information Sheet and instruction to access the V-Safe system.   [name] was instructed to call 911 with any severe reactions post vaccine: Marland Kitchen Difficulty breathing  . Swelling of face and throat  . A fast heartbeat  . A bad rash all over body  . Dizziness and weakness   Immunizations Administered    Name Date Dose VIS Date Route   Moderna COVID-19 Vaccine 05/11/2019  9:01 AM 0.5 mL 01/30/2019 Intramuscular   Manufacturer: Moderna   Lot: [id number]   NDC: 22297-989-21

## 2019-06-13 ENCOUNTER — Ambulatory Visit: Payer: Self-pay | Attending: Internal Medicine

## 2019-06-13 ENCOUNTER — Ambulatory Visit: Payer: Self-pay

## 2019-06-13 DIAGNOSIS — Z23 Encounter for immunization: Secondary | ICD-10-CM

## 2019-06-13 NOTE — Progress Notes (Signed)
   Covid-19 Vaccination Clinic  Name:  Jenny Gregory    MRN: [id number] DOB: 18-Nov-1960  06/13/2019  [name] was observed post Covid-19 immunization for 15 minutes without incident. She was provided with Vaccine Information Sheet and instruction to access the V-Safe system.   [name] was instructed to call 911 with any severe reactions post vaccine: Marland Kitchen Difficulty breathing  . Swelling of face and throat  . A fast heartbeat  . A bad rash all over body  . Dizziness and weakness   Immunizations Administered    Name Date Dose VIS Date Route   Moderna COVID-19 Vaccine 06/13/2019  3:53 PM 0.5 mL 01/30/2019 Intramuscular   Manufacturer: Moderna   Lot: [id number]   NDC: 32761-470-92

## 2020-12-11 ENCOUNTER — Emergency Department (HOSPITAL_COMMUNITY): Payer: Self-pay

## 2020-12-11 ENCOUNTER — Emergency Department (HOSPITAL_COMMUNITY)
Admission: EM | Admit: 2020-12-11 | Discharge: 2020-12-11 | Disposition: A | Discharge status: Home / Self Care | Payer: Self-pay | Attending: Emergency Medicine | Admitting: Emergency Medicine

## 2020-12-11 ENCOUNTER — Other Ambulatory Visit: Payer: Self-pay

## 2020-12-11 ENCOUNTER — Encounter (HOSPITAL_COMMUNITY): Payer: Self-pay | Admitting: Emergency Medicine

## 2020-12-11 DIAGNOSIS — R0781 Pleurodynia: Secondary | ICD-10-CM | POA: Insufficient documentation

## 2020-12-11 DIAGNOSIS — Z96651 Presence of right artificial knee joint: Secondary | ICD-10-CM | POA: Insufficient documentation

## 2020-12-11 DIAGNOSIS — R0789 Other chest pain: Secondary | ICD-10-CM

## 2020-12-11 DIAGNOSIS — I10 Essential (primary) hypertension: Secondary | ICD-10-CM | POA: Insufficient documentation

## 2020-12-11 DIAGNOSIS — Z79899 Other long term (current) drug therapy: Secondary | ICD-10-CM | POA: Insufficient documentation

## 2020-12-11 DIAGNOSIS — J45909 Unspecified asthma, uncomplicated: Secondary | ICD-10-CM | POA: Insufficient documentation

## 2020-12-11 LAB — BASIC METABOLIC PANEL
Anion gap: 7 (ref 5–15)
BUN: 12 mg/dL (ref 6–20)
CO2: 25 mmol/L (ref 22–32)
Calcium: 9.5 mg/dL (ref 8.9–10.3)
Chloride: 105 mmol/L (ref 98–111)
Creatinine, Ser: 0.54 mg/dL (ref 0.44–1.00)
GFR, Estimated: >60 mL/min (ref >60)
Glucose, Bld: 88 mg/dL (ref 70–99)
Potassium: 3.4 mmol/L — ABNORMAL LOW (ref 3.5–5.1)
Sodium: 137 mmol/L (ref 135–145)

## 2020-12-11 LAB — CBC
HCT: 41.3 % (ref 36.0–46.0)
Hemoglobin: 12.9 g/dL (ref 12.0–15.0)
MCH: 28.9 pg (ref 26.0–34.0)
MCHC: 31.2 g/dL (ref 30.0–36.0)
MCV: 92.4 fL (ref 80.0–100.0)
Platelets: 281 10*3/uL (ref 150–400)
RBC: 4.47 MIL/uL (ref 3.87–5.11)
RDW: 13.7 % (ref 11.5–15.5)
WBC: 8.6 10*3/uL (ref 4.0–10.5)
nRBC: 0 % (ref 0.0–0.2)

## 2020-12-11 LAB — D-DIMER, QUANTITATIVE: D-Dimer, Quant: 0.38 ug/mL-FEU (ref 0.00–0.50)

## 2020-12-11 LAB — TROPONIN I (HIGH SENSITIVITY)
Troponin I (High Sensitivity): 3 ng/L (ref <18)
Troponin I (High Sensitivity): 4 ng/L (ref <18)

## 2020-12-11 MED ORDER — LIDOCAINE VISCOUS HCL 2 % MT SOLN
15.0000 mL | Freq: Once | OROMUCOSAL | Status: AC
  Administered 2020-12-11: 15 mL via ORAL
  Filled 2020-12-11: qty 15

## 2020-12-11 MED ORDER — ALUM & MAG HYDROXIDE-SIMETH 200-200-20 MG/5ML PO SUSP
30.0000 mL | Freq: Once | ORAL | Status: AC
  Administered 2020-12-11: 30 mL via ORAL
  Filled 2020-12-11: qty 30

## 2020-12-11 MED ORDER — KETOROLAC TROMETHAMINE 30 MG/ML IJ SOLN
30.0000 mg | Freq: Once | INTRAMUSCULAR | Status: AC
  Administered 2020-12-11: 30 mg via INTRAVENOUS
  Filled 2020-12-11: qty 1

## 2020-12-11 NOTE — ED Triage Notes (Signed)
Pt c/o left sided chest paint that started while at work today. Pain is reproducible with palpation.

## 2020-12-11 NOTE — ED Provider Notes (Signed)
[facility] EMERGENCY DEPARTMENT Provider Note   CSN: [id number] Arrival date & time: 12/11/20  1911     History Chief Complaint  Patient presents with   Chest Pain   Spanish translator was used throughout this evaluation.  Jenny Gregory is a 60 y.o. female.  HPI  60 year old female with a history of anemia, asthma, hypertension, who presents to the emergency department today for evaluation of chest pain.  Patient states that about an hour prior to arrival she developed left-sided chest pain that radiated to the left upper extremity.  She states it felt like a stabbing pain and felt like something was stuck.  She denies any associated nausea, vomiting, abdominal pain, diaphoresis or shortness of breath.  She does have some pleuritic pain associated with this.  She has never had similar symptoms before.  The symptoms occurred while she was walking into the house from outside.  The pain is improved somewhat since onset but is still present.  Past Medical History:  Diagnosis Date   Anemia    Asthma    Hypertension     There are no problems to display for this patient.   Past Surgical History:  Procedure Laterality Date   CESAREAN SECTION     KNEE ARTHROSCOPY WITH MEDIAL MENISECTOMY  01/12/2012   Procedure: KNEE ARTHROSCOPY WITH MEDIAL MENISECTOMY;  Surgeon: Nestor Lewandowsky, MD;  Location: [facility];  Service: Orthopedics;  Laterality: Right;  PARTIAL MEDIAL MENISECTOMY, chondroplasty   TUBAL LIGATION       OB History     Gravida  9   Para  7   Term  7   Preterm      AB  2   Living         SAB  2   IAB      Ectopic      Multiple      Live Births              Family History  Problem Relation Age of Onset   Cancer Sister    Breast cancer Sister 91   Cancer Other     Social History   Tobacco Use   Smoking status: Never   Smokeless tobacco: Never  Substance Use Topics   Alcohol use: No   Drug use: No    Home  Medications Prior to Admission medications   Medication Sig Start Date End Date Taking? Authorizing Provider  albuterol (PROVENTIL HFA;VENTOLIN HFA) 108 (90 BASE) MCG/ACT inhaler Inhale 2 puffs into the lungs every 6 (six) hours as needed for wheezing or shortness of breath.     [provider]  hydrOXYzine (VISTARIL) 50 MG capsule Take 1 capsule by mouth every 8 (eight) hours as needed. For 03/31/15   [provider]  ibuprofen (ADVIL,MOTRIN) 200 MG tablet Take 200 mg by mouth every 6 (six) hours as needed for mild pain or moderate pain.     [provider]  lisinopril (PRINIVIL,ZESTRIL) 10 MG tablet Take 10 mg by mouth at bedtime.     [provider]    Allergies    Patient has no known allergies.  Review of Systems   Review of Systems  Constitutional:  Negative for chills and fever.  HENT:  Negative for ear pain and sore throat.   Eyes:  Negative for visual disturbance.  Respiratory:  Negative for cough and shortness of breath.   Cardiovascular:  Positive for chest pain. Negative for leg swelling.  Gastrointestinal:  Negative for abdominal pain, constipation, diarrhea, nausea and vomiting.  Genitourinary:  Negative for dysuria and hematuria.  Musculoskeletal:  Negative for back pain.  Skin:  Negative for rash.  Neurological:  Negative for headaches.  All other systems reviewed and are negative.  Physical Exam Updated Vital Signs BP (!) 141/79   Pulse 64   Temp 98.5 F (36.9 C) (Oral)   Resp 18   Ht 5' (1.524 m)   Wt 108.9 kg   SpO2 90%   BMI 46.87 kg/m   Physical Exam Vitals and nursing note reviewed.  Constitutional:      General: She is not in acute distress.    Appearance: She is well-developed.  HENT:     Head: Normocephalic and atraumatic.  Eyes:     Conjunctiva/sclera: Conjunctivae normal.  Cardiovascular:     Rate and Rhythm: Normal rate and regular rhythm.     Heart sounds: No murmur heard. Pulmonary:     Effort:  Pulmonary effort is normal. No respiratory distress.     Breath sounds: Normal breath sounds.  Abdominal:     Palpations: Abdomen is soft.     Tenderness: There is no abdominal tenderness.  Musculoskeletal:     Cervical back: Neck supple.     Right lower leg: No tenderness. No edema.     Left lower leg: No tenderness. No edema.  Skin:    General: Skin is warm and dry.  Neurological:     Mental Status: She is alert.    ED Results / Procedures / Treatments   Labs (all labs ordered are listed, but only abnormal results are displayed) Labs Reviewed  BASIC METABOLIC PANEL - Abnormal; Notable for the following components:      Result Value   Potassium 3.4 (*)    All other components within normal limits  CBC  D-DIMER, QUANTITATIVE  TROPONIN I (HIGH SENSITIVITY)  TROPONIN I (HIGH SENSITIVITY)    EKG EKG Interpretation  Date/Time:  Thursday December 11 2020 19:18:11 EDT Ventricular Rate:  79 PR Interval:  179 QRS Duration: 95 QT Interval:  403 QTC Calculation: 462 R Axis:   -3 Text Interpretation: Sinus rhythm Confirmed by Alona Bene [phone]) on 12/11/2020 7:28:11 PM  Radiology DG Chest 2 View  Result Date: 12/11/2020 CLINICAL DATA:  Chest pain.  Left side. EXAM: CHEST - 2 VIEW COMPARISON:  Chest x-ray 11/12/2011 FINDINGS: The heart and mediastinal contours are unchanged. No focal consolidation. No pulmonary edema. No pleural effusion. No pneumothorax. No acute osseous abnormality. IMPRESSION: No active cardiopulmonary disease. Electronically Signed   By: Tish Frederickson M.D.   On: 12/11/2020 20:17    Procedures Procedures   Medications Ordered in ED Medications  ketorolac (TORADOL) 30 MG/ML injection 30 mg (30 mg Intravenous Given 12/11/20 2058)  alum & mag hydroxide-simeth (MAALOX/MYLANTA) 200-200-20 MG/5ML suspension 30 mL (30 mLs Oral Given 12/11/20 2058)    And  lidocaine (XYLOCAINE) 2 % viscous mouth solution 15 mL (15 mLs Oral Given 12/11/20 2058)    ED Course   I have reviewed the triage vital signs and the nursing notes.  Pertinent labs & imaging results that were available during my care of the patient were reviewed by me and considered in my medical decision making (see chart for details).    MDM Rules/Calculators/A&P                          60 year old female presents for evaluation of  chest pain that started prior to arrival.  Associated with left arm pain.  No other associated symptoms.  Reviewed/interpreted labs CBC unremarkable BMP with mild hypokalemia, otherwise unremarkable Trop negative, delta trop negative Ddimer negative, doubt PE  EKG with NSR, no ischemic changes   Reviewed/interpreted imaging CXR - no pna, ptx, widened mediastinum, or other acute abnormality  Pt was given toradol and a GI cocktail, on reassessment she feels completely improved.  Her cardiac work-up is negative and I highly doubt ACS.  Doubt PE, dissection, esophageal rupture or other emergent process at this time.  Feel that she is appropriate for further work-up/management with PCP and referral was given.  Have advised on specific return precautions.  She voices understanding of the plan and reasons to return.  All questions answered.  Patient stable for discharge.  Final Clinical Impression(s) / ED Diagnoses Final diagnoses:  Atypical chest pain    Rx / DC Orders ED Discharge Orders     None        Rayne Du 12/11/20 2246    Maia Plan, MD 12/17/20 [phone]

## 2020-12-11 NOTE — Discharge Instructions (Addendum)
Please follow up with your primary care provider within 5-7 days for re-evaluation of your symptoms. If you do not have a primary care provider, information for a healthcare clinic has been provided for you to make arrangements for follow up care. Please return to the emergency department for any new or worsening symptoms.  --------------------------------------------------------------------   Jenny Gregory un seguimiento con su proveedor de atencin primaria dentro de los 5 a 7 das para una reevaluacin de sus sntomas. Si no tiene un proveedor de Marine scientist, se le ha proporcionado informacin sobre una clnica de atencin mdica para que pueda hacer arreglos para la atencin de seguimiento. Regrese al departamento de emergencias por cualquier sntoma nuevo o que empeore.

## 2021-12-12 ENCOUNTER — Ambulatory Visit (INDEPENDENT_AMBULATORY_CARE_PROVIDER_SITE_OTHER): Payer: Self-pay

## 2021-12-12 ENCOUNTER — Ambulatory Visit
Admission: RE | Admit: 2021-12-12 | Discharge: 2021-12-12 | Disposition: A | Discharge status: Home / Self Care | Payer: Self-pay | Source: Ambulatory Visit

## 2021-12-12 VITALS — BP 136/85 | HR 75 | Temp 97.9°F | Resp 18

## 2021-12-12 DIAGNOSIS — M25561 Pain in right knee: Secondary | ICD-10-CM

## 2021-12-12 DIAGNOSIS — M1711 Unilateral primary osteoarthritis, right knee: Secondary | ICD-10-CM

## 2021-12-12 DIAGNOSIS — B353 Tinea pedis: Secondary | ICD-10-CM

## 2021-12-12 DIAGNOSIS — Z9889 Other specified postprocedural states: Secondary | ICD-10-CM

## 2021-12-12 MED ORDER — PREDNISONE 50 MG PO TABS: 50.0000 mg | ORAL_TABLET | Freq: Every day | ORAL | 0 refills | Status: DC

## 2021-12-12 MED ORDER — FLUCONAZOLE 150 MG PO TABS: 150.0000 mg | ORAL_TABLET | ORAL | 0 refills | Status: DC

## 2021-12-12 NOTE — ED Provider Notes (Signed)
[facility]  Note:  This document was prepared using Dragon voice recognition software and may include unintentional dictation errors.  MRN: [id number] DOB: August 10, 1960  Subjective:   Jenny Gregory is a 61 y.o. female presenting for 43-month history of persistent severe itching and dry skin of her right foot.  She is also had persistent right knee pain with swelling for the past few months.  Patient has a history of knee arthroscopy with medial meniscectomy from 2013.  Patient does a lot of long hours on her feet and walks extensively.  No history of diabetes.  No falls, trauma.  No current facility-administered medications for this encounter.  Current Outpatient Medications:    acetaminophen (TYLENOL) 500 MG tablet, Take 500 mg by mouth every 6 (six) hours as needed., Disp: , Rfl:    albuterol (PROVENTIL HFA;VENTOLIN HFA) 108 (90 BASE) MCG/ACT inhaler, Inhale 2 puffs into the lungs every 6 (six) hours as needed for wheezing or shortness of breath. , Disp: , Rfl:    FLUoxetine (PROZAC) 40 MG capsule, Take 40 mg by mouth daily., Disp: , Rfl:    GLUMETZA 1000 MG 24 hr tablet, Take 1,000 mg by mouth daily., Disp: , Rfl:    hydrOXYzine (VISTARIL) 50 MG capsule, Take 1 capsule by mouth every 8 (eight) hours as needed. For, Disp: , Rfl: 1   ibuprofen (ADVIL,MOTRIN) 200 MG tablet, Take 200 mg by mouth every 6 (six) hours as needed for mild pain or moderate pain. , Disp: , Rfl:    lisinopril (PRINIVIL,ZESTRIL) 10 MG tablet, Take 10 mg by mouth at bedtime. , Disp: , Rfl:    No Known Allergies  Past Medical History:  Diagnosis Date   Anemia    Asthma    Hypertension      Past Surgical History:  Procedure Laterality Date   CESAREAN SECTION     KNEE ARTHROSCOPY WITH MEDIAL MENISECTOMY  01/12/2012   Procedure: KNEE ARTHROSCOPY WITH MEDIAL MENISECTOMY;  Surgeon: Nestor Lewandowsky, MD;  Location: [facility];  Service: Orthopedics;  Laterality: Right;  PARTIAL  MEDIAL MENISECTOMY, chondroplasty   TUBAL LIGATION      Family History  Problem Relation Age of Onset   Cancer Sister    Breast cancer Sister 51   Cancer Other     Social History   Tobacco Use   Smoking status: Never   Smokeless tobacco: Never  Substance Use Topics   Alcohol use: No   Drug use: Never    ROS   Objective:   Vitals: BP 136/85 (BP Location: Right Arm)   Pulse 75   Temp 97.9 F (36.6 C) (Oral)   Resp 18   SpO2 92%   Physical Exam Constitutional:      General: She is not in acute distress.    Appearance: Normal appearance. She is well-developed. She is obese. She is not ill-appearing, toxic-appearing or diaphoretic.  HENT:     Head: Normocephalic and atraumatic.     Nose: Nose normal.     Mouth/Throat:     Mouth: Mucous membranes are moist.  Eyes:     General: No scleral icterus.       Right eye: No discharge.        Left eye: No discharge.     Extraocular Movements: Extraocular movements intact.  Cardiovascular:     Rate and Rhythm: Normal rate.  Pulmonary:     Effort: Pulmonary effort is normal.  Musculoskeletal:     Right  knee: Swelling and bony tenderness present. No deformity, effusion, erythema, ecchymosis, lacerations or crepitus. Decreased range of motion. Tenderness present over the medial joint line and patellar tendon. No lateral joint line tenderness. Normal alignment and normal patellar mobility.  Skin:    General: Skin is warm and dry.     Comments: Macerated dry scaly skin in the webspaces of her toes for the right foot.  She also has scantly dry plaques over the plantar surface distal medially.   Neurological:     General: No focal deficit present.     Mental Status: She is alert and oriented to person, place, and time.  Psychiatric:        Mood and Affect: Mood normal.        Behavior: Behavior normal.     DG Knee Complete 4 Views Right  Result Date: 12/12/2021 CLINICAL DATA:  Right knee pain. EXAM: RIGHT KNEE - COMPLETE  4+ VIEW COMPARISON:  None Available. FINDINGS: No fracture. No subluxation dislocation. Loss of joint space noted medial compartment with hypertrophic spurring. No joint effusion. Bones are diffusely demineralized. IMPRESSION: Degenerative changes in the medial compartment. No acute bony abnormality. Electronically Signed   By: Misty Stanley M.D.   On: 12/12/2021 13:46     Assessment and Plan :   PDMP not reviewed this encounter.  1. Osteoarthritis of right knee, unspecified osteoarthritis type   2. Acute pain of right knee   3. Tinea pedis of right foot   4. H/O medial meniscus repair of right knee     In the context of her right knee arthritis and severe pain, recommended a oral prednisone course.  Follow-up with an orthopedist.  Provide her with information to a clinic in [place].  Recommended covering for tinea pedis with oral fluconazole for 6 weeks.  Counseled patient on potential for adverse effects with medications prescribed/recommended today, ER and return-to-clinic precautions discussed, patient verbalized understanding.    Jenny Eagles, PA-C 12/12/21 1447

## 2021-12-12 NOTE — ED Triage Notes (Signed)
Pt reports itching in right foot x 2 months; bilateral knee pain x 4 months. Pain is worse when walking and standing for extended period of time. Tylenol and knee brace gives some relief.

## 2022-07-01 ENCOUNTER — Ambulatory Visit
Admission: EM | Admit: 2022-07-01 | Discharge: 2022-07-01 | Disposition: A | Discharge status: Home / Self Care | Payer: Self-pay

## 2022-07-01 ENCOUNTER — Encounter: Payer: Self-pay | Admitting: Emergency Medicine

## 2022-07-01 ENCOUNTER — Other Ambulatory Visit: Payer: Self-pay

## 2022-07-01 DIAGNOSIS — R42 Dizziness and giddiness: Secondary | ICD-10-CM | POA: Insufficient documentation

## 2022-07-01 LAB — POCT URINALYSIS DIP (MANUAL ENTRY)
Bilirubin, UA: NEGATIVE
Blood, UA: NEGATIVE
Glucose, UA: NEGATIVE mg/dL
Ketones, POC UA: NEGATIVE mg/dL
Nitrite, UA: NEGATIVE
Protein Ur, POC: NEGATIVE mg/dL
Spec Grav, UA: 1.025 (ref 1.010–1.025)
Urobilinogen, UA: 1 E.U./dL
pH, UA: 6 (ref 5.0–8.0)

## 2022-07-01 MED ORDER — MECLIZINE HCL 12.5 MG PO TABS: 12.5000 mg | ORAL_TABLET | Freq: Three times a day (TID) | ORAL | 0 refills | Status: AC | PRN

## 2022-07-01 NOTE — Discharge Instructions (Addendum)
Your urinalysis was normal, your EKG also shows that you are not having a heart attack.  We are collecting blood work.  You will be contacted if the results of the blood work are abnormal. I am prescribing medication to help with your dizziness.  The medication may cause you to become drowsy.  Do not drive, drink alcohol, or operate heavy equipment when taking the medication. May take over-the-counter Tylenol as needed for your headache. Increase fluids and allow for plenty of rest.  Try to drink at least 8-10 8 ounce glasses of water daily. Make sure you are taking your blood pressure medication as prescribed.  I would like for you to begin checking your blood pressure daily.  You can purchase a blood pressure cuff from a local pharmacy.  Begin keeping a log of your blood pressure and take this with you to your next appointment with your primary care physician. Go to the emergency department immediately if you develop worsening dizziness, headache, lightheadedness, continued fatigue, or other concerns. Follow-up as needed.

## 2022-07-01 NOTE — ED Provider Notes (Signed)
[facility]    CSN: [id number] Arrival date & time: 07/01/22  1640      History   Chief Complaint No chief complaint on file.   HPI Jenny Gregory is a 62 y.o. female.   The history is provided by the patient. Language interpreter used: Jazmin [phone].   The patient presents for complaints of dizziness that been present for the past several days.  Patient states today at work, she felt more sleepy, and felt that her symptoms are worsening.  Patient states she has worsening dizziness when lying down and when leaning forward.  She also complains of a headache in the top of her head and in the back of her head.  Patient reports that she does have a history of high blood pressure.  She states that she has not had any fever, chills, chest pain, shortness of breath, difficulty breathing, abdominal pain, nausea, vomiting, or diabetes.  Patient states that she has had blurred vision that comes and goes, but not often.  Patient reports that she had the same or similar symptoms a few years ago.  She states she was told that she needed to make sure she was drinking plenty of water at that time.  Past Medical History:  Diagnosis Date   Anemia    Asthma    Hypertension     There are no problems to display for this patient.   Past Surgical History:  Procedure Laterality Date   CESAREAN SECTION     KNEE ARTHROSCOPY WITH MEDIAL MENISECTOMY  01/12/2012   Procedure: KNEE ARTHROSCOPY WITH MEDIAL MENISECTOMY;  Surgeon: Nestor Lewandowsky, MD;  Location: [facility];  Service: Orthopedics;  Laterality: Right;  PARTIAL MEDIAL MENISECTOMY, chondroplasty   TUBAL LIGATION      OB History     Gravida  9   Para  7   Term  7   Preterm      AB  2   Living         SAB  2   IAB      Ectopic      Multiple      Live Births               Home Medications    Prior to Admission medications   Medication Sig Start Date End Date Taking? Authorizing Provider   atorvastatin (LIPITOR) 20 MG tablet Take 20 mg by mouth daily.   Yes [provider]  meclizine (ANTIVERT) 12.5 MG tablet Take 1 tablet (12.5 mg total) by mouth 3 (three) times daily as needed for dizziness. 07/01/22  Yes Kamie Korber-Warren, Sadie Haber, NP  acetaminophen (TYLENOL) 500 MG tablet Take 500 mg by mouth every 6 (six) hours as needed.    [provider]  albuterol (PROVENTIL HFA;VENTOLIN HFA) 108 (90 BASE) MCG/ACT inhaler Inhale 2 puffs into the lungs every 6 (six) hours as needed for wheezing or shortness of breath.     [provider]  FLUoxetine (PROZAC) 40 MG capsule Take 40 mg by mouth daily. 09/30/21   [provider]  GLUMETZA 1000 MG 24 hr tablet Take 1,000 mg by mouth daily. 09/30/21   [provider]  ibuprofen (ADVIL,MOTRIN) 200 MG tablet Take 200 mg by mouth every 6 (six) hours as needed for mild pain or moderate pain.     [provider]  lisinopril (PRINIVIL,ZESTRIL) 10 MG tablet Take 20 mg by mouth at bedtime.    [provider]  Family History Family History  Problem Relation Age of Onset   Cancer Sister    Breast cancer Sister 60   Cancer Other     Social History Social History   Tobacco Use   Smoking status: Never   Smokeless tobacco: Never  Substance Use Topics   Alcohol use: No   Drug use: Never     Allergies   Patient has no known allergies.   Review of Systems Review of Systems Per HPI  Physical Exam Triage Vital Signs ED Triage Vitals  Enc Vitals Group     BP 07/01/22 1718 (!) 153/91     Pulse Rate 07/01/22 1718 78     Resp 07/01/22 1719 20     Temp 07/01/22 1718 98.8 F (37.1 C)     Temp Source 07/01/22 1718 Oral     SpO2 07/01/22 1719 91 %     Weight --      Height --      Head Circumference --      Peak Flow --      Pain Score 07/01/22 1720 7     Pain Loc --      Pain Edu? --      Excl. in GC? --    Orthostatic VS for the past 24 hrs:  BP- Lying Pulse- Lying BP-  Sitting Pulse- Sitting BP- Standing at 0 minutes Pulse- Standing at 0 minutes  07/01/22 1724 (!) 152/92 73 136/83 75 140/89 84    Updated Vital Signs BP (!) 153/91 (BP Location: Right Arm)   Pulse 78   Temp 98.8 F (37.1 C) (Oral)   Resp 20   SpO2 91%   Visual Acuity Right Eye Distance:   Left Eye Distance:   Bilateral Distance:    Right Eye Near:   Left Eye Near:    Bilateral Near:     Physical Exam Vitals and nursing note reviewed.  Constitutional:      General: She is not in acute distress.    Appearance: Normal appearance.  HENT:     Head: Normocephalic.  Eyes:     Extraocular Movements: Extraocular movements intact.     Conjunctiva/sclera: Conjunctivae normal.     Pupils: Pupils are equal, round, and reactive to light.  Cardiovascular:     Rate and Rhythm: Normal rate and regular rhythm.     Pulses: Normal pulses.     Heart sounds: Normal heart sounds.  Pulmonary:     Effort: Pulmonary effort is normal.     Breath sounds: Normal breath sounds.  Abdominal:     General: Bowel sounds are normal.     Palpations: Abdomen is soft.     Tenderness: There is no abdominal tenderness.  Musculoskeletal:     Cervical back: Normal range of motion.     Right lower leg: 1+ Edema present.     Left lower leg: 1+ Edema present.  Skin:    General: Skin is warm and dry.  Neurological:     General: No focal deficit present.     Mental Status: She is alert and oriented to person, place, and time.  Psychiatric:        Mood and Affect: Mood normal.        Behavior: Behavior normal.      UC Treatments / Results  Labs (all labs ordered are listed, but only abnormal results are displayed) Labs Reviewed  POCT URINALYSIS DIP (MANUAL ENTRY) - Abnormal; Notable for the following components:  Result Value   Leukocytes, UA Trace (*)    All other components within normal limits  URINE CULTURE  COMPREHENSIVE METABOLIC PANEL  CBC WITH DIFFERENTIAL/PLATELET    EKG: Normal  sinus rhythm, no acute STEMI, compared to EKG from 12/12/2020.   Radiology No results found.  Procedures Procedures (including critical care time)  Medications Ordered in UC Medications - No data to display  Initial Impression / Assessment and Plan / UC Course  I have reviewed the triage vital signs and the nursing notes.  Pertinent labs & imaging results that were available during my care of the patient were reviewed by me and considered in my medical decision making (see chart for details).  Patient presents for complaints of dizziness and headache that been present for the past 3 days.  Neurological exam is within normal limits.  EKG was normal sinus rhythm with no STEMI, orthostatic vital signs were also negative.  The patient's urinalysis did not show obvious signs of infection.  Difficult to ascertain the cause of the patient's dizziness at this time.  CBC, CMP, and urine culture obtained for safety.  Patient does have some lower extremity pitting edema on exam.  Patient was prescribed meclizine 12.5 mg for dizziness.  Will have patient follow-up with her primary care physician within the next 3 to 5 days for reevaluation.  Patient's blood pressure was elevated when she presented.  Supportive care recommendations were provided and discussed with the patient to include increasing her fluid intake, recommended that she drink at least 8-10 8 ounce glasses of water daily, over-the-counter Tylenol as needed for pain, fever, or general discomfort, and getting plenty of rest.  Patient was given strict ER follow-up precautions.  Patient was in agreement with this plan of care and verbalizes understanding.  All questions were answered.  Patient stable for discharge.   Final Clinical Impressions(s) / UC Diagnoses   Final diagnoses:  Dizziness  Lightheadedness     Discharge Instructions      Your urinalysis was normal, your EKG also shows that you are not having a heart attack.  We are  collecting blood work.  You will be contacted if the results of the blood work are abnormal. I am prescribing medication to help with your dizziness.  The medication may cause you to become drowsy.  Do not drive, drink alcohol, or operate heavy equipment when taking the medication. May take over-the-counter Tylenol as needed for your headache. Increase fluids and allow for plenty of rest.  Try to drink at least 8-10 8 ounce glasses of water daily. Make sure you are taking your blood pressure medication as prescribed.  I would like for you to begin checking your blood pressure daily.  You can purchase a blood pressure cuff from a local pharmacy.  Begin keeping a log of your blood pressure and take this with you to your next appointment with your primary care physician. Go to the emergency department immediately if you develop worsening dizziness, headache, lightheadedness, continued fatigue, or other concerns. Follow-up as needed.      ED Prescriptions     Medication Sig Dispense Auth. Provider   meclizine (ANTIVERT) 12.5 MG tablet Take 1 tablet (12.5 mg total) by mouth 3 (three) times daily as needed for dizziness. 30 tablet Ellasyn Swilling-Warren, Sadie Haber, NP      PDMP not reviewed this encounter.   Abran Cantor, NP 07/01/22 1858

## 2022-07-01 NOTE — ED Triage Notes (Signed)
Headaches and dizziness since Monday.  Pain on top  of head and back of neck.  Dizziness becomes worse when lying down and bending down.  History of tick bite under left arm 2 weeks ago.  Unsure if head is still in her skin.

## 2022-07-02 LAB — URINE CULTURE: Culture: NO GROWTH

## 2022-07-02 LAB — COMPREHENSIVE METABOLIC PANEL
ALT: 14 IU/L (ref 0–32)
AST: 14 IU/L (ref 0–40)
Albumin/Globulin Ratio: 1.5 (ref 1.2–2.2)
Albumin: 4.2 g/dL (ref 3.8–4.9)
Alkaline Phosphatase: 110 IU/L (ref 44–121)
BUN/Creatinine Ratio: 25 — ABNORMAL HIGH (ref 9–23)
BUN: 14 mg/dL (ref 6–24)
Bilirubin Total: 0.3 mg/dL (ref 0.0–1.2)
CO2: 25 mmol/L (ref 20–29)
Calcium: 10.8 mg/dL — ABNORMAL HIGH (ref 8.7–10.2)
Chloride: 107 mmol/L — ABNORMAL HIGH (ref 96–106)
Creatinine, Ser: 0.56 mg/dL — ABNORMAL LOW (ref 0.57–1.00)
Globulin, Total: 2.8 g/dL (ref 1.5–4.5)
Glucose: 122 mg/dL — ABNORMAL HIGH (ref 70–99)
Potassium: 4.3 mmol/L (ref 3.5–5.2)
Sodium: 141 mmol/L (ref 134–144)
Total Protein: 7 g/dL (ref 6.0–8.5)
eGFR: 105 mL/min/{1.73_m2} (ref >59)

## 2022-07-02 LAB — CBC WITH DIFFERENTIAL/PLATELET
Basophils Absolute: 0.1 10*3/uL (ref 0.0–0.2)
Basos: 1 %
EOS (ABSOLUTE): 0.4 10*3/uL (ref 0.0–0.4)
Eos: 5 %
Hematocrit: 40.7 % (ref 34.0–46.6)
Hemoglobin: 13.2 g/dL (ref 11.1–15.9)
Immature Grans (Abs): 0 10*3/uL (ref 0.0–0.1)
Immature Granulocytes: 0 %
Lymphocytes Absolute: 3.2 10*3/uL — ABNORMAL HIGH (ref 0.7–3.1)
Lymphs: 37 %
MCH: 28 pg (ref 26.6–33.0)
MCHC: 32.4 g/dL (ref 31.5–35.7)
MCV: 86 fL (ref 79–97)
Monocytes Absolute: 0.6 10*3/uL (ref 0.1–0.9)
Monocytes: 7 %
Neutrophils Absolute: 4.4 10*3/uL (ref 1.4–7.0)
Neutrophils: 50 %
Platelets: 297 10*3/uL (ref 150–450)
RBC: 4.71 x10E6/uL (ref 3.77–5.28)
RDW: 13 % (ref 11.7–15.4)
WBC: 8.8 10*3/uL (ref 3.4–10.8)

## 2023-12-28 ENCOUNTER — Encounter: Payer: Self-pay | Admitting: Obstetrics and Gynecology
